# Patient Record
Sex: Male | Born: 1980 | Race: Black or African American | Hispanic: No | Marital: Single | Smoking: Never smoker
Health system: Southern US, Community
[De-identification: ages and names within clinical notes are randomized; demographics above are authoritative.]

## PROBLEM LIST (undated history)

## (undated) DIAGNOSIS — K219 Gastro-esophageal reflux disease without esophagitis: Secondary | ICD-10-CM

## (undated) DIAGNOSIS — F4310 Post-traumatic stress disorder, unspecified: Secondary | ICD-10-CM

## (undated) DIAGNOSIS — G4730 Sleep apnea, unspecified: Secondary | ICD-10-CM

## (undated) DIAGNOSIS — I10 Essential (primary) hypertension: Secondary | ICD-10-CM

## (undated) HISTORY — PX: OTHER SURGICAL HISTORY: SHX169

---

## 2004-01-24 ENCOUNTER — Emergency Department (HOSPITAL_COMMUNITY): Admission: EM | Admit: 2004-01-24 | Discharge: 2004-01-24 | Payer: Self-pay | Admitting: Emergency Medicine

## 2005-05-03 ENCOUNTER — Emergency Department (HOSPITAL_COMMUNITY): Admission: EM | Admit: 2005-05-03 | Discharge: 2005-05-03 | Payer: Self-pay | Admitting: Emergency Medicine

## 2005-05-04 ENCOUNTER — Emergency Department (HOSPITAL_COMMUNITY): Admission: EM | Admit: 2005-05-04 | Discharge: 2005-05-04 | Payer: Self-pay | Admitting: Emergency Medicine

## 2005-05-05 ENCOUNTER — Ambulatory Visit (HOSPITAL_COMMUNITY): Admission: RE | Admit: 2005-05-05 | Discharge: 2005-05-05 | Payer: Self-pay | Admitting: Emergency Medicine

## 2010-08-04 ENCOUNTER — Ambulatory Visit (HOSPITAL_BASED_OUTPATIENT_CLINIC_OR_DEPARTMENT_OTHER): Admission: RE | Admit: 2010-08-04 | Discharge: 2010-08-04 | Payer: Self-pay | Admitting: Ophthalmology

## 2011-03-16 NOTE — Consult Note (Signed)
[name], VOTH NO.:  [id number]   MEDICAL RECORD NO.:  [id number]          PATIENT TYPE:  EMS   LOCATION:  [facility]                         FACILITY:  [facility]   PHYSICIAN:  Marlan Palau, M.D.  DATE OF BIRTH:  1980/12/24   DATE OF CONSULTATION:  05/03/2005  DATE OF DISCHARGE:                                   CONSULTATION   HISTORY OF PRESENT ILLNESS:  Larry Dean is a 30 year old right handed  black gentleman born [birth year] with a history of some borderline  hypertension. The patient has not been on any medications. Does not have a  primary care physician. The patient noted a week ago that he had onset of  some right sided headache ear pain followed by weakness of the right face.  Two to three days later, the patient noted the same thing happening on the  left side with left ear pain and weakness involving the left side of the  face. The patient presents with gradually worsening facial diplegia, some  alteration in speech due to the facial diplegia, trouble keeping fluids in  the mouth, alteration in taste to some degree. The patient has noted no  numbness or weakness on the arms or legs. The patient denies double vision.  Has had some tearing of the right eye and scratchy feeling to the right eye.  The patient denies balance troubles. Again, no problems in controlling the  bowels or the bladder. The patient was seen at [facility] and was sent to the emergency room for evaluation. The patient denies  fevers or chills. Does report some sort of a bug bite within the last week  or two, prior to this evaluation. The patient denies any tic exposure.   PAST MEDICAL HISTORY:  1.  History of facial diplegia.  2.  Borderline hypertension.   MEDICATIONS:  None.   SOCIAL HISTORY:  Does not smoke or drink. The patient is single. Does work  out as a Writer. Has 1 daughter who is alive and well.   FAMILY HISTORY:  Mother is alive. Has a  history of HIV. Father has history  of alcoholism. The patient has 1 brother and 1 sister who are alive and  well.   ALLERGIES:  No known drug allergies.   REVIEW OF SYSTEMS:  No recent fevers or chills. The patient denies any  headaches other than what has been mentioned. Denies neck stiffness. Does  occasional feel as if the nose is stopped up but otherwise, no significant  problems breathing. The patient feels as if his tongue is swollen. Denies  chest pain. Denies nausea and vomiting. No abdominal pain. Denies blackout  episodes, dizziness, or seizures.   PHYSICAL EXAMINATION:  VITAL SIGNS:  Blood pressure is 129/77, heart rate  86, respiratory rate 16, temperature afebrile.  GENERAL:  The patient is a fairly well developed, black male who is alert  and cooperative at the time of examination.  HEENT:  Head atraumatic. Pupils are equal, round, and reactive to light.  Disks soft and flat bilaterally.  NECK:  Supple. NO carotid bruits noted.  RESPIRATORY:  Examination is clear.  CARDIOVASCULAR:  Examination reveals regular rate and rhythm. No obvious  murmurs or rubs noted.  EXTREMITIES:  Without significant edema.  NEUROLOGIC:  Cranial nerves as above. The patient has obvious facial  weakness bilaterally in a peripheral 7th. The patient has full extraocular  movements. Visual fields are full. The patient has good jaw strength and  good strength above the tongue with tongue in the midline. Good lateral  strength of the tongue. Good strength with head turning and shoulder shrug  bilaterally. Speech is without aphasia. Is slightly dysarthric. Motor tests  reveal 5 out of 5 strength in all 4's. Good symmetric motor tone. Sensory  testing is intact to pin prick, soft touch sensation throughout including  the face. The patient has good vibratory sensation of all 4's. The patient  has good finger-to-nose and heel-to-shin. The patient is not ambulated. Deep  tendon reflexes are suppressed  but present including ankle jerks. Toes  downgoing bilaterally. No drift is seen. Tympanic membranes appear to be  slightly erythematous bilaterally, left greater than right.   LABORATORY DATA:  Values are pending at this time.   IMPRESSION:  Facial diplegia.   The patient has examination consistent with bilateral Bell's palsy. It seems  to have begun on the right first and then spread to the left. When taken in  isolation, the history is very consistent with a simple Bell's palsy with  pain around the ear, onset of weakness in the right fact. Is isolated to the  facial nerve, some involvement of taste. No other cranial nerve  abnormalities noted. Given the bilateral onset of the problem, differential  diagnosis is a bit different. Do need to consider and rule out the  possibility of human immunodeficiency virus infection, sarcoidosis, Lyme's  disease, and even consider the possibility of a glioma of the pons, although  onset is rather rapid for this. Will proceed with further workup at this  time.   PLAN:  1.  MRI scan of the brain with and without gadolinium.  2.  Check blood work today to include sed rate, ANA, rheumatoid factor. Will      check angiotensin converting enzyme level and human immunodeficiency      virus panel.  3.  Consider lumbar puncture following MRI scan of the brain with and      without gadolinium. LP will be checked for Lyme's panel, low grade      meningitis. The patient will require outpatient followup.  4.  Again, there is a chance that this simply could be an unusual case of      bilateral Bell's palsy in isolation.       CKW/MEDQ  D:  05/03/2005  T:  05/03/2005  Job:  [id number]   cc:   [facility]  [address]   [facility]  [phone]  [place], [place] [address]

## 2012-10-26 ENCOUNTER — Emergency Department (HOSPITAL_COMMUNITY): Payer: Self-pay

## 2012-10-26 ENCOUNTER — Encounter (HOSPITAL_COMMUNITY): Payer: Self-pay | Admitting: Internal Medicine

## 2012-10-26 ENCOUNTER — Observation Stay (HOSPITAL_COMMUNITY)
Admission: EM | Admit: 2012-10-26 | Discharge: 2012-10-27 | Disposition: A | Discharge status: Home / Self Care | Payer: Self-pay | Attending: Internal Medicine | Admitting: Internal Medicine

## 2012-10-26 DIAGNOSIS — I10 Essential (primary) hypertension: Secondary | ICD-10-CM | POA: Diagnosis present

## 2012-10-26 DIAGNOSIS — R51 Headache: Secondary | ICD-10-CM | POA: Insufficient documentation

## 2012-10-26 DIAGNOSIS — G510 Bell's palsy: Principal | ICD-10-CM | POA: Diagnosis present

## 2012-10-26 DIAGNOSIS — I160 Hypertensive urgency: Secondary | ICD-10-CM | POA: Diagnosis present

## 2012-10-26 DIAGNOSIS — G43109 Migraine with aura, not intractable, without status migrainosus: Secondary | ICD-10-CM

## 2012-10-26 DIAGNOSIS — G459 Transient cerebral ischemic attack, unspecified: Secondary | ICD-10-CM | POA: Diagnosis present

## 2012-10-26 DIAGNOSIS — R519 Headache, unspecified: Secondary | ICD-10-CM | POA: Diagnosis present

## 2012-10-26 HISTORY — DX: Essential (primary) hypertension: I10

## 2012-10-26 LAB — CBC WITH DIFFERENTIAL/PLATELET
Basophils Relative: 0 % (ref 0–1)
HCT: 41.4 % (ref 39.0–52.0)
Lymphocytes Relative: 31 % (ref 12–46)
Lymphs Abs: 1.9 10*3/uL (ref 0.7–4.0)
MCH: 27.1 pg (ref 26.0–34.0)
MCV: 80.2 fL (ref 78.0–100.0)
Monocytes Absolute: 0.7 10*3/uL (ref 0.1–1.0)
Monocytes Relative: 11 % (ref 3–12)
Neutro Abs: 3.5 10*3/uL (ref 1.7–7.7)
Neutrophils Relative %: 57 % (ref 43–77)
RBC: 5.16 MIL/uL (ref 4.22–5.81)

## 2012-10-26 LAB — COMPREHENSIVE METABOLIC PANEL
Alkaline Phosphatase: 79 U/L (ref 39–117)
BUN: 9 mg/dL (ref 6–23)
CO2: 25 mEq/L (ref 19–32)
Potassium: 3.6 mEq/L (ref 3.5–5.1)
Sodium: 138 mEq/L (ref 135–145)

## 2012-10-26 MED ORDER — METOCLOPRAMIDE HCL 5 MG/ML IJ SOLN: 10.0000 mg | Freq: Once | INTRAMUSCULAR | Status: DC

## 2012-10-26 MED ORDER — DIPHENHYDRAMINE HCL 50 MG/ML IJ SOLN: 25.0000 mg | Freq: Once | INTRAMUSCULAR | Status: DC

## 2012-10-26 MED ORDER — KETOROLAC TROMETHAMINE 30 MG/ML IJ SOLN
30.0000 mg | Freq: Once | INTRAMUSCULAR | Status: AC
  Administered 2012-10-26: 30 mg via INTRAVENOUS
  Filled 2012-10-26: qty 1

## 2012-10-26 MED ORDER — DEXAMETHASONE SODIUM PHOSPHATE 10 MG/ML IJ SOLN
10.0000 mg | Freq: Once | INTRAMUSCULAR | Status: AC
  Administered 2012-10-26: 10 mg via INTRAVENOUS
  Filled 2012-10-26: qty 1

## 2012-10-26 MED ORDER — DEXAMETHASONE SODIUM PHOSPHATE 10 MG/ML IJ SOLN: 10.0000 mg | Freq: Once | INTRAMUSCULAR | Status: DC

## 2012-10-26 MED ORDER — METOCLOPRAMIDE HCL 5 MG/ML IJ SOLN
10.0000 mg | Freq: Once | INTRAMUSCULAR | Status: AC
  Administered 2012-10-26: 10 mg via INTRAVENOUS
  Filled 2012-10-26: qty 2

## 2012-10-26 MED ORDER — DIPHENHYDRAMINE HCL 50 MG/ML IJ SOLN
25.0000 mg | Freq: Once | INTRAMUSCULAR | Status: AC
  Administered 2012-10-26: 25 mg via INTRAVENOUS
  Filled 2012-10-26: qty 1

## 2012-10-26 NOTE — H&P (Signed)
PCP:  [facility]   Chief Complaint:   Right facial droop  HPI: Larry Dean is a 31 y.o. male   has a past medical history of Hypertension.   Presented with  He woke up  With right sided headache, persistent Right facial numbness, he endorses slurred speech and trouble eating because of facial numbness.  He have had a recent chest cold but no fever or chills. He has no hx of migraine headaches.  Denies any weakness in upper or lower extremities.  Patient states he has been diagnosed with HTN in the past but forgot what medicine he is supposed to be on and never filled in the prescription.  ER MD has contacted Neurology who felt he needs to be admitted for TIA work up.    Review of Systems:     Pertinent positives include: headaches,  slurred speech, tingling,  Constitutional:  No weight loss, night sweats, Fevers, chills, fatigue, weight loss  HEENT:  No Difficulty swallowing,Tooth/dental problems,Sore throat,  No sneezing, itching, ear ache, nasal congestion, post nasal drip,  Cardio-vascular:  No chest pain, Orthopnea, PND, anasarca, dizziness, palpitations.no Bilateral lower extremity swelling  GI:  No heartburn, indigestion, abdominal pain, nausea, vomiting, diarrhea, change in bowel habits, loss of appetite, melena, blood in stool, hematemesis Resp:  no shortness of breath at rest. No dyspnea on exertion, No excess mucus, no productive cough, No non-productive cough, No coughing up of blood.No change in color of mucus.No wheezing. Skin:  no rash or lesions. No jaundice GU:  no dysuria, change in color of urine, no urgency or frequency. No straining to urinate.  No flank pain.  Musculoskeletal:  No joint pain or no joint swelling. No decreased range of motion. No back pain.  Psych:  No change in mood or affect. No depression or anxiety. No memory loss.  Neuro: no localizing neurological complaints, no  no weakness, no double vision, no gait abnormality,  no , no confusion  Otherwise ROS are negative except for above, 10 systems were reviewed  Past Medical History: Past Medical History  Diagnosis Date  . Hypertension    History reviewed. No pertinent past surgical history.   Medications: Prior to Admission medications   Medication Sig Start Date End Date Taking? Authorizing Provider  acetaminophen (TYLENOL) 500 MG tablet Take 1,000 mg by mouth every 6 (six) hours as needed. pain   Yes Historical Provider, MD    Allergies:  No Known Allergies  Social History:  Ambulatory  independently   Lives at   home   reports that he has never smoked. He does not have any smokeless tobacco history on file. He reports that he drinks alcohol. He reports that he does not use illicit drugs.   Family History: family history includes Cancer in his father.    Physical Exam: Patient Vitals for the past 24 hrs:  BP Temp Temp src Pulse Resp SpO2  10/26/12 1903 143/81 mmHg - - 81  - -  10/26/12 1629 151/79 mmHg - - 74  20  98 %  10/26/12 1555 180/108 mmHg 98.2 F (36.8 C) Oral 81  15  97 %    1. General:  in No Acute distress 2. Psychological: Alert and  Oriented 3. Head/ENT:   Moist   Mucous Membranes                          Head Non traumatic, neck supple  Normal   Dentition 4. SKIN: normal Skin turgor,  Skin clean Dry and intact no rash 5. Heart: Regular rate and rhythm no Murmur, Rub or gallop, but distant 6. Lungs: Clear to auscultation bilaterally, no wheezes or crackles, but distant 7. Abdomen: Soft, non-tender, Non distended, obese 8. Lower extremities: no clubbing, cyanosis, or edema, obese 9. Neurologically: Right facial droop noted with numbness of right cheek. He is able to lift up eyebrows symetricaly and close both eyes no eye deviation noted. Patient feels that the muscles in his forehead and right eye are weaker but I am not able to perceive this.  10. MSK: Normal range of motion  body mass index  is unknown because there is no height or weight on file.   Labs on Admission:   [facility] 10/26/12 1715  NA 138  K 3.6  CL 105  CO2 25  GLUCOSE 101*  BUN 9  CREATININE 0.90  CALCIUM 8.6  MG --  PHOS --    Basename 10/26/12 1715  AST 24  ALT 43  ALKPHOS 79  BILITOT 0.4  PROT 7.2  ALBUMIN 3.6   No results found for this basename: LIPASE:2,AMYLASE:2 in the last 72 hours  Basename 10/26/12 1715  WBC 6.2  NEUTROABS 3.5  HGB 14.0  HCT 41.4  MCV 80.2  PLT 257   No results found for this basename: CKTOTAL:3,CKMB:3,CKMBINDEX:3,TROPONINI:3 in the last 72 hours No results found for this basename: TSH,T4TOTAL,FREET3,T3FREE,THYROIDAB in the last 72 hours No results found for this basename: VITAMINB12:2,FOLATE:2,FERRITIN:2,TIBC:2,IRON:2,RETICCTPCT:2 in the last 72 hours No results found for this basename: HGBA1C    CrCl is unknown because there is no height on file for the current visit. ABG No results found for this basename: phart, pco2, po2, hco3, tco2, acidbasedef, o2sat     No results found for this basename: DDIMER     Other results:  I have pearsonaly reviewed this: ECG REPORT  Rate:84  Rhythm: NSR ST&T Change: no ischemic changes   Cultures: No results found for this basename: sdes, specrequest, cult, reptstatus       Radiological Exams on Admission: Ct Head Wo Contrast  10/26/2012  *RADIOLOGY REPORT*  Clinical Data: Right facial numbness.  Headache.  CT HEAD WITHOUT CONTRAST  Technique:  Contiguous axial images were obtained from the base of the skull through the vertex without contrast.  Comparison: None.  Findings: No acute intracranial abnormality.  Specifically, no hemorrhage, hydrocephalus, mass lesion, acute infarction, or significant intracranial injury.  No acute calvarial abnormality. Visualized paranasal sinuses and mastoids clear.  Orbital soft tissues unremarkable.  IMPRESSION: Normal study   Original Report Authenticated By: Charlett Nose, M.D.      Chart has been reviewed  Assessment/Plan  31 yo with Headache, uncontrolled HTN, and right facial droop with questionable peripheral CN 7 involvement suggestive of possible Bell's palsy.   Present on Admission:  . TIA (transient ischemic attack) - vs atypical migraine vs Bell's palsy -  will admit based on TIA  protocol, await results of MRA/MRI, Carotid Doppler and Echo, obtain cardiac enzymes,  ECG,  Lipid panel, TSH. Order PT/OT evaluation. Will make sure patient is on antiplatelet agent.   Neurology is aware of the patient. PAtient is to be transferred to [facility] for further treatment and evaluation.  Marland Kitchen HTN (hypertension), malignant - tomorrow will initiate Norvasc 5 mg po qd and this can adjusted further as needed, for now labetalol PRN severe HTN. Current SBP 140's.  Marland Kitchen Headache - currently improving after  blood pressure under better control.  . Hypertensive urgency - patient presented with elevated blood pressure and neurological complaints. Currently blood pressure under better control he need to have close outpatient follow up to further adjust his medication.   Prophylaxis: SCD     CODE STATUS: FULL CODE  Other plan as per orders.  I have spent a total of 55 min on this admission  Larry Dean 10/26/2012, 10:34 PM

## 2012-10-26 NOTE — ED Provider Notes (Signed)
History     CSN: [id number]  Arrival date & time 10/26/12  1550   First MD Initiated Contact with Patient 10/26/12 1605      Chief Complaint  Patient presents with  . Headache    (Consider location/radiation/quality/duration/timing/severity/associated sxs/prior treatment) HPI Comments: Patient with history of HTN and Bells Palsy, presents with complaint of headaches with right sided headache. Associated symptoms include right eye twitching, right sided numbness, and slurred speech at 8:00 am. Patient has not had symptoms like this before. Denies fever or chills. Denies neck stiffness. Denies NVD or abdominal pain. Denies visual disturbance.   The history is provided by the patient. No language interpreter was used.    No past medical history on file.  No past surgical history on file.  No family history on file.  History  Substance Use Topics  . Smoking status: Not on file  . Smokeless tobacco: Not on file  . Alcohol Use: Not on file      Review of Systems  Constitutional: Negative for fever and chills.  HENT: Negative for neck stiffness.   Eyes: Negative for photophobia and visual disturbance.  Gastrointestinal: Negative for nausea, vomiting, abdominal pain and diarrhea.  Neurological: Positive for headaches. Negative for facial asymmetry and numbness.    Allergies  Review of patient's allergies indicates no known allergies.  Home Medications   Current Outpatient Rx  Name  Route  Sig  Dispense  Refill  . ACETAMINOPHEN 500 MG PO TABS   Oral   Take 1,000 mg by mouth every 6 (six) hours as needed. pain           BP 151/79  Pulse 74  Temp 98.2 F (36.8 C) (Oral)  Resp 20  SpO2 98%  Physical Exam  Nursing note and vitals reviewed. Constitutional: He is oriented to person, place, and time. He appears well-developed and well-nourished.  HENT:  Head: Normocephalic and atraumatic.  Mouth/Throat: Oropharynx is clear and moist.       No temporal bruits  appreciated.  Eyes: Conjunctivae normal and EOM are normal. No scleral icterus.  Neck: Normal range of motion. Neck supple.  Cardiovascular: Normal rate, regular rhythm and normal heart sounds.   Pulmonary/Chest: Effort normal and breath sounds normal.  Abdominal: Soft. Bowel sounds are normal. There is no tenderness.  Neurological: He is alert and oriented to person, place, and time. He exhibits normal muscle tone. Coordination normal.       Cranial nerves II-XII grossly intact. Impaired sensation to light touch on the right. No strength deficits. Good finger to nose. No pronator drift. Negative romberg. No facial droop.  Fasiculations noted on right cheek. Increased fasciculation while smiling.  Skin: Skin is warm and dry.    ED Course  Procedures (including critical care time)  Labs Reviewed - No data to display Ct Head Wo Contrast  10/26/2012  *RADIOLOGY REPORT*  Clinical Data: Right facial numbness.  Headache.  CT HEAD WITHOUT CONTRAST  Technique:  Contiguous axial images were obtained from the base of the skull through the vertex without contrast.  Comparison: None.  Findings: No acute intracranial abnormality.  Specifically, no hemorrhage, hydrocephalus, mass lesion, acute infarction, or significant intracranial injury.  No acute calvarial abnormality. Visualized paranasal sinuses and mastoids clear.  Orbital soft tissues unremarkable.  IMPRESSION: Normal study   Original Report Authenticated By: Charlett Nose, M.D.        MDM  Patient presented with presentation consistent with complicated migraine vs Majorie Santee. Impaired sensation and  right sided fasciculation on exam. CT head negative. Patient visit shared with Rhunette Croft who believes that Neurology should be consulted for admission and MRI in the morning. Patient care signed out to [facility]. Disposition pending.     Pixie Casino, PA-C 10/26/12 2015

## 2012-10-26 NOTE — ED Notes (Signed)
Pt states he woke up this morning with R side facial numbness and headache to R temple area. Pt also states his R eye was twitching. Pt states his speech was slurred when he woke up. Pt arrives with clear speech. Pt states he still has a headache to the R side of his head. States numbness to R side of face has resolved. Pt report recent illness. States he had a had a head cold this past week. Pt a/o x 4. Grips equal. No pronator drift. No facial droop.

## 2012-10-27 DIAGNOSIS — G510 Bell's palsy: Principal | ICD-10-CM | POA: Diagnosis present

## 2012-10-27 DIAGNOSIS — R6889 Other general symptoms and signs: Secondary | ICD-10-CM

## 2012-10-27 DIAGNOSIS — I10 Essential (primary) hypertension: Secondary | ICD-10-CM

## 2012-10-27 DIAGNOSIS — R0989 Other specified symptoms and signs involving the circulatory and respiratory systems: Secondary | ICD-10-CM

## 2012-10-27 LAB — LIPID PANEL
Cholesterol: 177 mg/dL (ref 0–200)
LDL Cholesterol: 111 mg/dL — ABNORMAL HIGH (ref 0–99)
LDL Cholesterol: 103 mg/dL — ABNORMAL HIGH (ref 0–99)
Total CHOL/HDL Ratio: 2.8 RATIO
Triglycerides: 48 mg/dL (ref <150)
VLDL: 9 mg/dL (ref 0–40)
VLDL: 10 mg/dL (ref 0–40)

## 2012-10-27 LAB — HEMOGLOBIN A1C: Mean Plasma Glucose: 123 mg/dL — ABNORMAL HIGH (ref <117)

## 2012-10-27 MED ORDER — METOCLOPRAMIDE HCL 5 MG/ML IJ SOLN
10.0000 mg | Freq: Once | INTRAMUSCULAR | Status: AC
  Administered 2012-10-27: 10 mg via INTRAVENOUS
  Filled 2012-10-27: qty 2

## 2012-10-27 MED ORDER — INFLUENZA VIRUS VACC SPLIT PF IM SUSP: 0.5000 mL | INTRAMUSCULAR | Status: DC

## 2012-10-27 MED ORDER — ASPIRIN 325 MG PO TABS
325.0000 mg | ORAL_TABLET | Freq: Every day | ORAL | Status: DC
  Administered 2012-10-27: 325 mg via ORAL
  Filled 2012-10-27: qty 1

## 2012-10-27 MED ORDER — TEARS RENEWED OP SOLN: 1.0000 [drp] | Freq: Four times a day (QID) | OPHTHALMIC | Status: DC | PRN

## 2012-10-27 MED ORDER — SODIUM CHLORIDE 0.9 % IV SOLN
INTRAVENOUS | Status: AC
  Administered 2012-10-27: 02:00:00 via INTRAVENOUS

## 2012-10-27 MED ORDER — ASPIRIN 325 MG PO TABS: 325.0000 mg | ORAL_TABLET | Freq: Every day | ORAL | Status: DC

## 2012-10-27 MED ORDER — LABETALOL HCL 5 MG/ML IV SOLN: 10.0000 mg | INTRAVENOUS | Status: DC | PRN

## 2012-10-27 MED ORDER — AMLODIPINE BESYLATE 10 MG PO TABS: 10.0000 mg | ORAL_TABLET | Freq: Every day | ORAL | Status: DC

## 2012-10-27 MED ORDER — INFLUENZA VIRUS VACC SPLIT PF IM SUSP
0.5000 mL | INTRAMUSCULAR | Status: DC
  Filled 2012-10-27: qty 0.5

## 2012-10-27 MED ORDER — PREDNISONE 10 MG PO TABS: 10.0000 mg | ORAL_TABLET | Freq: Every day | ORAL | Status: DC

## 2012-10-27 MED ORDER — KETOROLAC TROMETHAMINE 30 MG/ML IJ SOLN
30.0000 mg | Freq: Once | INTRAMUSCULAR | Status: AC
  Administered 2012-10-27: 30 mg via INTRAVENOUS
  Filled 2012-10-27: qty 1

## 2012-10-27 MED ORDER — ACETAMINOPHEN 325 MG PO TABS
650.0000 mg | ORAL_TABLET | ORAL | Status: DC | PRN
  Administered 2012-10-27: 650 mg via ORAL
  Filled 2012-10-27: qty 2

## 2012-10-27 MED ORDER — AMLODIPINE BESYLATE 5 MG PO TABS
5.0000 mg | ORAL_TABLET | Freq: Every day | ORAL | Status: DC
  Administered 2012-10-27: 5 mg via ORAL
  Filled 2012-10-27: qty 1

## 2012-10-27 NOTE — Progress Notes (Signed)
[facility] progress note. Chief complaint. Transfer note. History of present illness. This 31 year old male presented to [facility] emergency room with right facial droop. He has a past medical history of hypertension and he states he has previously had Bell's palsy. Neurology was also contacted and felt that the patient to require transfer to [facility] for TIA workup. Patient has now arrived and seeing the patient to ensure he remains medically stable and that his orders transferred appropriately. He states that the numbness to the right face and tongue are unchanged. Vital signs. Temperature 98.4, pulse 85, respiration 18, blood pressure 159/91. O2 sats 98%. General appearance. This is an obese young male who is alert, cooperative, in no distress. Cardiac. Rate and rhythm regular. No jugular venous distention or significant edema. Lungs. Breath sounds clear and equal. Abdomen. Soft with positive bowel sounds. No pain. Neurologic. Cranial nerves 2-12 grossly intact. I note no facial drooping at present. I find no unilateral or focal defects. Impression/plan. Problem #1. TIA versus atypical migraine versus Bell's palsy. Patient has been transferred to [facility] for TIA protocol which will include MRI/MRA, carotid Doppler, echocardiogram, etc. Patient will be seen by neurology in consult. Problem #2 hypertensive urgency. Her blood pressure is now more stable and labetalol when necessary in place as needed. Patient appears medically stable post transfer. All orders appear to transferred appropriately.

## 2012-10-27 NOTE — Progress Notes (Signed)
  Echocardiogram 2D Echocardiogram has been performed.  Larry Dean 10/27/2012, 10:18 AM

## 2012-10-27 NOTE — Progress Notes (Addendum)
NEURO HOSPITALIST PROGRESS NOTE   SUBJECTIVE:                                                                                                                         Patients facial weakness and sensation have improved. He states he only feels slightly weaker on the right side and that he has no significant numbness on right face.  He can feel his right aspect of his face but it "just feels funny".  OBJECTIVE:                                                                                                                           Vital signs in last 24 hours: Temp:  [97.6 F (36.4 C)-98.4 F (36.9 C)] 97.6 F (36.4 C) (12/30 0553) Pulse Rate:  [74-86] 81  (12/30 0553) Resp:  [15-20] 20  (12/30 0553) BP: (143-180)/(77-108) 150/77 mmHg (12/30 0553) SpO2:  [97 %-100 %] 100 % (12/30 0553) Weight:  [169.5 kg (373 lb 10.9 oz)] 169.5 kg (373 lb 10.9 oz) (12/30 0135)  Intake/Output from previous day: 12/29 0701 - 12/30 0700 In: 240 [P.O.:240] Out: -  Intake/Output this shift:   Nutritional status: Cardiac  Past Medical History  Diagnosis Date  . Hypertension     Neurologic Exam:  Mental Status: Alert, oriented, thought content appropriate.  Speech fluent without evidence of aphasia.  Able to follow 3 step commands without difficulty. Cranial Nerves: II: Visual fields grossly normal, pupils equal, round, reactive to light and accommodation III,IV, VI: ptosis not present, extra-ocular motions intact bilaterally V,VII: smile symmetric, at rest still shows a right decrease NL fold. facial light touch sensation decreased on right face more notably over right corner of his mouth. Movement of frontalis equal bilaterally, decrease ability to fully burry right eye lashes.  VIII: hearing normal bilaterally IX,X: gag reflex present XI: bilateral shoulder shrug XII: midline tongue extension Motor: Right : Upper extremity   5/5    Left:     Upper  extremity   5/5  Lower extremity   5/5     Lower extremity   5/5 Tone and bulk:normal tone throughout; no atrophy noted Sensory: Pinprick and light touch intact throughout, bilaterally Deep Tendon Reflexes:  1+ and symmetric throughout Plantars: Right: downgoing   Left: downgoing Cerebellar: normal finger-to-nose,  normal heel-to-shin test CV: pulses palpable throughout     Lab Results: No results found for this basename: cbc, bmp, coags, chol, tri, ldl, hga1c   Lipid Panel No results found for this basename: CHOL,TRIG,HDL,CHOLHDL,VLDL,LDLCALC in the last 72 hours  Studies/Results: Ct Head Wo Contrast  10/26/2012  *RADIOLOGY REPORT*  Clinical Data: Right facial numbness.  Headache.  CT HEAD WITHOUT CONTRAST  Technique:  Contiguous axial images were obtained from the base of the skull through the vertex without contrast.  Comparison: None.  Findings: No acute intracranial abnormality.  Specifically, no hemorrhage, hydrocephalus, mass lesion, acute infarction, or significant intracranial injury.  No acute calvarial abnormality. Visualized paranasal sinuses and mastoids clear.  Orbital soft tissues unremarkable.  IMPRESSION: Normal study   Original Report Authenticated By: Charlett Nose, M.D.     MEDICATIONS                                                                                                                        Scheduled:   . amLODipine  5 mg Oral Daily  . aspirin  325 mg Oral Daily    ASSESSMENT/PLAN:                                                                                                               Patient Active Hospital Problem List: Bell's Palsy(10/26/2012)   Assessment: Patients symptoms of decreased sensation have improved but not fully resolved. HE continues to show right obicularis oculi  Weakness and decreased taste on right tongue.    Plan:  1) MRI pending 2) Trial of prednisone 60 mg per day for one week then taper by 10 mg per day.  3)  Artificial tears and Lacri-Lube this patient develops inability to close his right eye.    Felicie Morn PA-C [facility] [phone]  10/27/2012, 8:21 AM   I have reviewed this note.   Ritta Slot, MD [facility] [phone]  If 7pm- 7am, please page neurology on call at [phone].

## 2012-10-27 NOTE — Progress Notes (Signed)
OT/PT DISCHARGE  Order received, chart reviewed, in to seen pt earlier but he was coming out of the bathroom by himself headed towards the door of his room to get into a W/C to go down for a test. Pt able to manage the bathroom door without issues and no balance issues noted when he was walking from bathroom to the door of his room, nor turning around to sit in W/C. In to speak to pt about OT/PT services and pt reports no issues with BADLs or mobility, only issue is still numbness in face--but he reports it feels better today than yesterday (that his speech is better and not quite as numb feeling on his face). No OT/PT needs noted, will not eval. Acute OT/PT to sign off.

## 2012-10-27 NOTE — Consult Note (Signed)
NEURO HOSPITALIST CONSULT NOTE    Reason for Consult: Right facial and tongue numbness.  HPI:                                                                                                                                          Larry Dean is an 31 y.o. male history of hypertension and history of Bell's palsy 5 years ago presenting with right facial numbness as well as numbness of right side of his tongue. Symptoms were present when he woke up this morning. He had similar right facial and tongue numbness 5 years ago at the onset of Bell's palsy. He indicates that he subsequently developed left Bell's palsy as well. There was complete resolution of facial weakness on both sides as well as return of normal taste sensation. He has lost taste on the right side of his tongue with his current symptoms. He has not noticed any weakness of the right side of his face. He's had pain on the right side of his head. CT scan of his head showed no acute intracranial abnormality. He's had no focal weakness nor numbness involving extremities. Speech has not changed.  Past Medical History  Diagnosis Date  . Hypertension    Bell's palsy 5 years ago, right and left.  History reviewed. No pertinent past surgical history.  Family History  Problem Relation Age of Onset  . Cancer Father     Social History:  reports that he has never smoked. He does not have any smokeless tobacco history on file. He reports that he drinks alcohol. He reports that he does not use illicit drugs.  No Known Allergies  MEDICATIONS:                                                                                                                     Prior to Admission:  Prescriptions prior to admission  Medication Sig Dispense Refill  . acetaminophen (TYLENOL) 500 MG tablet Take 1,000 mg by mouth every 6 (six) hours as needed. pain          Blood pressure 159/91, pulse 85, temperature 98.4 F (36.9 C),  temperature source Oral, resp. rate 18, height 5\' 10"  (1.778 m), weight 169.5 kg (373 lb 10.9 oz), SpO2 98.00%.  Neurologic Examination:                                                                                                      Mental Status: Alert, oriented, thought content appropriate.  Speech fluent without evidence of aphasia. Able to follow commands without difficulty. Cranial Nerves: II-Visual fields were normal. III/IV/VI-Pupils were equal and reacted. Extraocular movements were full and conjugate.    V/VII-reduced sensation to tactile stimulation over the entire right face, compared to left; mild weakness of left frontalis, orbicularis oculi and orbicularis oris muscles; no weakness of facial muscles on the left side. VIII-normal. X-normal speech and symmetrical palatal movement. XII-midline tongue extension Motor: 5/5 bilaterally with normal tone and bulk Sensory: Normal over right and left extremities. Deep Tendon Reflexes: 1+ and symmetric. Plantars: Flexor bilaterally Cerebellar: Normal finger-to-nose testing.  No results found for this basename: cbc, bmp, coags, chol, tri, ldl, hga1c    Results for orders placed during the hospital encounter of 10/26/12 (from the past 48 hour(s))  CBC WITH DIFFERENTIAL     Status: Normal   Collection Time   10/26/12  5:15 PM      Component Value Range Comment   WBC 6.2  4.0 - 10.5 K/uL    RBC 5.16  4.22 - 5.81 MIL/uL    Hemoglobin 14.0  13.0 - 17.0 g/dL    HCT 14.7  82.9 - 56.2 %    MCV 80.2  78.0 - 100.0 fL    MCH 27.1  26.0 - 34.0 pg    MCHC 33.8  30.0 - 36.0 g/dL    RDW 13.0  86.5 - 78.4 %    Platelets 257  150 - 400 K/uL    Neutrophils Relative 57  43 - 77 %    Neutro Abs 3.5  1.7 - 7.7 K/uL    Lymphocytes Relative 31  12 - 46 %    Lymphs Abs 1.9  0.7 - 4.0 K/uL    Monocytes Relative 11  3 - 12 %    Monocytes Absolute 0.7  0.1 - 1.0 K/uL    Eosinophils Relative 1  0 - 5 %    Eosinophils Absolute 0.1  0.0 - 0.7  K/uL    Basophils Relative 0  0 - 1 %    Basophils Absolute 0.0  0.0 - 0.1 K/uL   COMPREHENSIVE METABOLIC PANEL     Status: Abnormal   Collection Time   10/26/12  5:15 PM      Component Value Range Comment   Sodium 138  135 - 145 mEq/L    Potassium 3.6  3.5 - 5.1 mEq/L    Chloride 105  96 - 112 mEq/L    CO2 25  19 - 32 mEq/L    Glucose, Bld 101 (*) 70 - 99 mg/dL    BUN 9  6 - 23 mg/dL    Creatinine, Ser 6.96  0.50 - 1.35 mg/dL    Calcium 8.6  8.4 - 29.5 mg/dL    Total Protein 7.2  6.0 - 8.3 g/dL  Albumin 3.6  3.5 - 5.2 g/dL    AST 24  0 - 37 U/L    ALT 43  0 - 53 U/L    Alkaline Phosphatase 79  39 - 117 U/L    Total Bilirubin 0.4  0.3 - 1.2 mg/dL    GFR calc non Af Amer >90  >90 mL/min    GFR calc Af Amer >90  >90 mL/min     Ct Head Wo Contrast  10/26/2012  *RADIOLOGY REPORT*  Clinical Data: Right facial numbness.  Headache.  CT HEAD WITHOUT CONTRAST  Technique:  Contiguous axial images were obtained from the base of the skull through the vertex without contrast.  Comparison: None.  Findings: No acute intracranial abnormality.  Specifically, no hemorrhage, hydrocephalus, mass lesion, acute infarction, or significant intracranial injury.  No acute calvarial abnormality. Visualized paranasal sinuses and mastoids clear.  Orbital soft tissues unremarkable.  IMPRESSION: Normal study   Original Report Authenticated By: Charlett Nose, M.D.      Assessment/Plan: Acute right facial weakness as well as mild right upper and lower facial numbness, most likely manifestations of recurrent Bell's palsy, including abnormal sensation and loss of taste involving the right side of his tongue. No clinical signs of stroke nor TIA.  Recommendations: 1. Agree with obtaining MRI of the brain is planned, without contrast. 2. No further TIA or stroke workup if MRI is unremarkable. 3. Trial of prednisone 60 mg per day for one week then taper by 10 mg per day. 4. Artificial tears and Lacri-Lube this  patient develops inability to close his right eye.  Venetia Maxon M.D. [facility] [phone]  10/27/2012, 1:38 AM

## 2012-10-27 NOTE — Progress Notes (Signed)
VASCULAR LAB PRELIMINARY  PRELIMINARY  PRELIMINARY  PRELIMINARY  Carotid duplex completed.    Preliminary report: No significant ICA stenosis or plaque noted bilaterally.  Vertebral artery flow antegrade.  Jonty Morrical, RVT 10/27/2012, 10:46 AM

## 2012-10-27 NOTE — ED Provider Notes (Signed)
Medical screening examination/treatment/procedure(s) were performed by non-physician practitioner and as supervising physician I was immediately available for consultation/collaboration.  Daxx Tiggs, MD 10/27/12 0011 

## 2012-10-27 NOTE — Discharge Summary (Signed)
Physician Discharge Summary  Larry Dean MRN: [id number] DOB/AGE: 1981-08-31 31 y.o.  PCP: No primary provider on file.   Admit date: 10/26/2012 Discharge date: 10/27/2012  Discharge Diagnoses:     TIA (transient ischemic attack)  HTN (hypertension), malignant  Headache  Hypertensive urgency  Bell's palsy     Medication List     As of 10/27/2012  8:25 AM    TAKE these medications         acetaminophen 500 MG tablet   Commonly known as: TYLENOL   Take 1,000 mg by mouth every 6 (six) hours as needed. pain      amLODipine 10 MG tablet   Commonly known as: NORVASC   Take 1 tablet (10 mg total) by mouth daily.      aspirin 325 MG tablet   Take 1 tablet (325 mg total) by mouth daily.      dextran 70-hypromellose ophthalmic solution   Place 1 drop into both eyes 4 (four) times daily as needed.      predniSONE 10 MG tablet   Commonly known as: DELTASONE   Take 1 tablet (10 mg total) by mouth daily.        Discharge Condition: Stable  Disposition: Final discharge disposition not confirmed   Consults: Neurology   Significant Diagnostic Studies: Ct Head Wo Contrast  10/26/2012  *RADIOLOGY REPORT*  Clinical Data: Right facial numbness.  Headache.  CT HEAD WITHOUT CONTRAST  Technique:  Contiguous axial images were obtained from the base of the skull through the vertex without contrast.  Comparison: None.  Findings: No acute intracranial abnormality.  Specifically, no hemorrhage, hydrocephalus, mass lesion, acute infarction, or significant intracranial injury.  No acute calvarial abnormality. Visualized paranasal sinuses and mastoids clear.  Orbital soft tissues unremarkable.  IMPRESSION: Normal study   Original Report Authenticated By: Charlett Nose, M.D.        Microbiology: No results found for this or any previous visit (from the past 240 hour(s)).   Labs: Results for orders placed during the hospital encounter of 10/26/12 (from the past 48 hour(s))    CBC WITH DIFFERENTIAL     Status: Normal   Collection Time   10/26/12  5:15 PM      Component Value Range Comment   WBC 6.2  4.0 - 10.5 K/uL    RBC 5.16  4.22 - 5.81 MIL/uL    Hemoglobin 14.0  13.0 - 17.0 g/dL    HCT 11.9  14.7 - 82.9 %    MCV 80.2  78.0 - 100.0 fL    MCH 27.1  26.0 - 34.0 pg    MCHC 33.8  30.0 - 36.0 g/dL    RDW 56.2  13.0 - 86.5 %    Platelets 257  150 - 400 K/uL    Neutrophils Relative 57  43 - 77 %    Neutro Abs 3.5  1.7 - 7.7 K/uL    Lymphocytes Relative 31  12 - 46 %    Lymphs Abs 1.9  0.7 - 4.0 K/uL    Monocytes Relative 11  3 - 12 %    Monocytes Absolute 0.7  0.1 - 1.0 K/uL    Eosinophils Relative 1  0 - 5 %    Eosinophils Absolute 0.1  0.0 - 0.7 K/uL    Basophils Relative 0  0 - 1 %    Basophils Absolute 0.0  0.0 - 0.1 K/uL   COMPREHENSIVE METABOLIC PANEL     Status:  Abnormal   Collection Time   10/26/12  5:15 PM      Component Value Range Comment   Sodium 138  135 - 145 mEq/L    Potassium 3.6  3.5 - 5.1 mEq/L    Chloride 105  96 - 112 mEq/L    CO2 25  19 - 32 mEq/L    Glucose, Bld 101 (*) 70 - 99 mg/dL    BUN 9  6 - 23 mg/dL    Creatinine, Ser 5.78  0.50 - 1.35 mg/dL    Calcium 8.6  8.4 - 46.9 mg/dL    Total Protein 7.2  6.0 - 8.3 g/dL    Albumin 3.6  3.5 - 5.2 g/dL    AST 24  0 - 37 U/L    ALT 43  0 - 53 U/L    Alkaline Phosphatase 79  39 - 117 U/L    Total Bilirubin 0.4  0.3 - 1.2 mg/dL    GFR calc non Af Amer >90  >90 mL/min    GFR calc Af Amer >90  >90 mL/min      HPI : Larry Dean is an 31 y.o. male history of hypertension and history of Bell's palsy 5 years ago presenting with right facial numbness as well as numbness of right side of his tongue. Symptoms were present when he woke up this morning. He had similar right facial and tongue numbness 5 years ago at the onset of Bell's palsy. He indicates that he subsequently developed left Bell's palsy as well. There was complete resolution of facial weakness on both sides as well as  return of normal taste sensation. He has lost taste on the right side of his tongue with his current symptoms. He has not noticed any weakness of the right side of his face. He's had pain on the right side of his head. CT scan of his head showed no acute intracranial abnormality. He's had no focal weakness nor numbness involving extremities. Speech has not changed.  HOSPITAL COURSE: Acute right facial weakness as well as mild right upper and lower facial numbness, most likely manifestations of recurrent Bell's palsy, including abnormal sensation and loss of taste involving the right side of his tongue. No clinical signs of stroke nor TIA.  Hospital course 1. MRI could not be obtained because of being overweight.  2. No further TIA or stroke workup 3. Trial of prednisone 60 mg per day for one week then taper by 10 mg per day.  4. Artificial tears and Lacri-Lube this patient develops inability to close his right eye. Follow up with neurology in 2 weeks   Discharge Exam:   Blood pressure 150/77, pulse 81, temperature 97.6 F (36.4 C), temperature source Oral, resp. rate 20, height 5\' 10"  (1.778 m), weight 169.5 kg (373 lb 10.9 oz), SpO2 100.00%.           Follow-up Information    Follow up with Primary care provider. Schedule an appointment as soon as possible for a visit in 1 week.      Follow up with Lesly Dukes, MD. Schedule an appointment as soon as possible for a visit in 2 weeks. (Followup of Bell's palsy)    Contact information:   [address], [address] [address] [facility] [place] [address] [phone]          Signed: Richarda Overlie 10/27/2012, 8:25 AM

## 2012-10-27 NOTE — Progress Notes (Signed)
OT/PT DISCHARGE  Order received, chart reviewed, in to seen pt earlier but he was coming out of the bathroom by himself headed towards the door of his room to get into a W/C to go down for a test. Pt able to manage the bathroom door without issues and no balance issues noted when he was walking from bathroom to the door of his room, nor turning around to sit in W/C. In to speak to pt about OT/PT services and pt reports no issues with BADLs or mobility, only issue is still numbness in face--but he reports it feels better today than yesterday (that his speech is better and not quite as numb feeling on his face). No OT/PT needs noted, will not eval. Acute OT/PT to sign off.  Lewis Shock, PT, DPT Pager #: [phone] Office #: [phone]

## 2012-10-27 NOTE — Progress Notes (Signed)
OT Cancellation Note  Patient Details Name: Larry Dean MRN: [id number] DOB: 03-29-1981   Cancelled Treatment:     Pt out of room for a test. Will try to check on him today since I see that he may go home today.  Evette Georges [phone] 10/27/2012, 10:07 AM

## 2012-10-28 NOTE — Care Management Note (Signed)
    Page 1 of 1   10/28/2012     8:17:05 AM   CARE MANAGEMENT NOTE 10/28/2012  Patient:  Larry Dean, Larry Dean   Account Number:  [id number]  Date Initiated:  10/27/2012  Documentation initiated by:  [facility]  Subjective/Objective Assessment:   Admitted for TIA workup     Action/Plan:   PT/OT evals-no follow up needed   Anticipated DC Date:  10/28/2012   Anticipated DC Plan:  HOME/SELF CARE      DC Planning Services  CM consult      Choice offered to / List presented to:             Status of service:  Completed, signed off Medicare Important Message given?   (If response is "NO", the following Medicare IM given date fields will be blank) Date Medicare IM given:   Date Additional Medicare IM given:    Discharge Disposition:  HOME/SELF CARE  Per UR Regulation:  Reviewed for med. necessity/level of care/duration of stay  If discussed at Long Length of Stay Meetings, dates discussed:    Comments:

## 2016-01-25 ENCOUNTER — Emergency Department (HOSPITAL_COMMUNITY): Payer: Managed Care, Other (non HMO)

## 2016-01-25 ENCOUNTER — Emergency Department (HOSPITAL_COMMUNITY)
Admission: EM | Admit: 2016-01-25 | Discharge: 2016-01-25 | Disposition: A | Discharge status: Home / Self Care | Payer: Managed Care, Other (non HMO) | Attending: Emergency Medicine | Admitting: Emergency Medicine

## 2016-01-25 ENCOUNTER — Encounter (HOSPITAL_COMMUNITY): Payer: Self-pay | Admitting: Emergency Medicine

## 2016-01-25 DIAGNOSIS — Z79899 Other long term (current) drug therapy: Secondary | ICD-10-CM | POA: Insufficient documentation

## 2016-01-25 DIAGNOSIS — R6889 Other general symptoms and signs: Secondary | ICD-10-CM

## 2016-01-25 DIAGNOSIS — I10 Essential (primary) hypertension: Secondary | ICD-10-CM | POA: Diagnosis not present

## 2016-01-25 DIAGNOSIS — R000 Tachycardia, unspecified: Secondary | ICD-10-CM | POA: Insufficient documentation

## 2016-01-25 DIAGNOSIS — M791 Myalgia: Secondary | ICD-10-CM | POA: Diagnosis not present

## 2016-01-25 DIAGNOSIS — J029 Acute pharyngitis, unspecified: Secondary | ICD-10-CM | POA: Insufficient documentation

## 2016-01-25 DIAGNOSIS — K5900 Constipation, unspecified: Secondary | ICD-10-CM | POA: Diagnosis not present

## 2016-01-25 DIAGNOSIS — R51 Headache: Secondary | ICD-10-CM | POA: Diagnosis not present

## 2016-01-25 DIAGNOSIS — R112 Nausea with vomiting, unspecified: Secondary | ICD-10-CM | POA: Insufficient documentation

## 2016-01-25 DIAGNOSIS — R5383 Other fatigue: Secondary | ICD-10-CM | POA: Diagnosis not present

## 2016-01-25 DIAGNOSIS — K429 Umbilical hernia without obstruction or gangrene: Secondary | ICD-10-CM | POA: Diagnosis not present

## 2016-01-25 DIAGNOSIS — R509 Fever, unspecified: Secondary | ICD-10-CM | POA: Insufficient documentation

## 2016-01-25 DIAGNOSIS — R1033 Periumbilical pain: Secondary | ICD-10-CM | POA: Insufficient documentation

## 2016-01-25 LAB — COMPREHENSIVE METABOLIC PANEL
ALT: 32 U/L (ref 17–63)
ANION GAP: 12 (ref 5–15)
AST: 27 U/L (ref 15–41)
Albumin: 4.1 g/dL (ref 3.5–5.0)
Alkaline Phosphatase: 71 U/L (ref 38–126)
BILIRUBIN TOTAL: 0.8 mg/dL (ref 0.3–1.2)
BUN: 8 mg/dL (ref 6–20)
CALCIUM: 9.3 mg/dL (ref 8.9–10.3)
CO2: 22 mmol/L (ref 22–32)
Chloride: 102 mmol/L (ref 101–111)
Creatinine, Ser: 1.11 mg/dL (ref 0.61–1.24)
GFR calc Af Amer: >60 mL/min (ref >60)
Glucose, Bld: 96 mg/dL (ref 65–99)
POTASSIUM: 3.9 mmol/L (ref 3.5–5.1)
SODIUM: 136 mmol/L (ref 135–145)
TOTAL PROTEIN: 7.3 g/dL (ref 6.5–8.1)

## 2016-01-25 LAB — URINALYSIS, ROUTINE W REFLEX MICROSCOPIC
BILIRUBIN URINE: NEGATIVE
Glucose, UA: NEGATIVE mg/dL
Hgb urine dipstick: NEGATIVE
KETONES UR: NEGATIVE mg/dL
NITRITE: NEGATIVE
PH: 6 (ref 5.0–8.0)
PROTEIN: NEGATIVE mg/dL
Specific Gravity, Urine: 1.029 (ref 1.005–1.030)

## 2016-01-25 LAB — I-STAT CG4 LACTIC ACID, ED
LACTIC ACID, VENOUS: 2.01 mmol/L — AB (ref 0.5–2.0)
LACTIC ACID, VENOUS: 1.78 mmol/L (ref 0.5–2.0)

## 2016-01-25 LAB — URINE MICROSCOPIC-ADD ON

## 2016-01-25 LAB — CBC
HEMATOCRIT: 43.2 % (ref 39.0–52.0)
Hemoglobin: 14.2 g/dL (ref 13.0–17.0)
MCH: 26.8 pg (ref 26.0–34.0)
MCHC: 32.9 g/dL (ref 30.0–36.0)
MCV: 81.7 fL (ref 78.0–100.0)
Platelets: 254 10*3/uL (ref 150–400)
RBC: 5.29 MIL/uL (ref 4.22–5.81)
RDW: 13.6 % (ref 11.5–15.5)
WBC: 13.9 10*3/uL — AB (ref 4.0–10.5)

## 2016-01-25 LAB — LIPASE, BLOOD: LIPASE: 18 U/L (ref 11–51)

## 2016-01-25 MED ORDER — HYDROMORPHONE HCL 1 MG/ML IJ SOLN
1.0000 mg | Freq: Once | INTRAMUSCULAR | Status: AC
  Administered 2016-01-25: 1 mg via INTRAVENOUS
  Filled 2016-01-25: qty 1

## 2016-01-25 MED ORDER — SODIUM CHLORIDE 0.9 % IV BOLUS (SEPSIS)
1000.0000 mL | Freq: Once | INTRAVENOUS | Status: AC
  Administered 2016-01-25: 1000 mL via INTRAVENOUS

## 2016-01-25 MED ORDER — ACETAMINOPHEN 325 MG PO TABS
650.0000 mg | ORAL_TABLET | Freq: Once | ORAL | Status: AC | PRN
  Administered 2016-01-25: 650 mg via ORAL

## 2016-01-25 MED ORDER — ONDANSETRON 4 MG PO TBDP: 4.0000 mg | ORAL_TABLET | Freq: Three times a day (TID) | ORAL | Status: DC | PRN

## 2016-01-25 MED ORDER — IOHEXOL 300 MG/ML  SOLN
50.0000 mL | Freq: Once | INTRAMUSCULAR | Status: AC | PRN
  Administered 2016-01-25: 50 mL via INTRAVENOUS

## 2016-01-25 MED ORDER — ACETAMINOPHEN 325 MG PO TABS
ORAL_TABLET | ORAL | Status: AC
  Filled 2016-01-25: qty 2

## 2016-01-25 NOTE — ED Notes (Signed)
Pt back from CT

## 2016-01-25 NOTE — ED Provider Notes (Signed)
Most other procedures CSN: [id number]     Arrival date & time 01/25/16  1342 History   First MD Initiated Contact with Patient 01/25/16 1740     Chief Complaint  Patient presents with  . Abdominal Pain  . Chills     HPI  35 y.o. male with history of hypertension presenting with 2 days of abdominal pain, fever, generalized myalgias, chills, vomiting, constipation. Patient has a ventral hernia just right of his umbilicus that present for about a year. He saw his primary care doctor regarding the hernia but was told that he needed to lose weight prior to surgery. Reports generalized abdominal pain worse around the location of the hernia over the last 2 days. He is able to fully reduce it when it pops out. The fever started last night, Tmax of 102 degrees. He had one episode of emesis. He has not had a bowel movement in 2 days which is unusual for him, and has been unable to pass flatus though he feels like he needs to. Also endorses mild sore throat, headaches when his fever spikes, and generalized body aches. He did not receive a flu shot this year. No history of prior abdominal surgeries.   Past Medical History  Diagnosis Date  . Hypertension    History reviewed. No pertinent past surgical history. Family History  Problem Relation Age of Onset  . Cancer Father    Social History  Substance Use Topics  . Smoking status: Never Smoker   . Smokeless tobacco: None  . Alcohol Use: Yes     Comment: once a week    Review of Systems  Constitutional: Positive for fever, chills and fatigue. Negative for activity change and appetite change.  HENT: Positive for sore throat. Negative for congestion, facial swelling and rhinorrhea.   Eyes: Negative for visual disturbance.  Respiratory: Negative for cough, shortness of breath and wheezing.   Cardiovascular: Negative for chest pain, palpitations and leg swelling.  Gastrointestinal: Positive for nausea, vomiting, abdominal pain and constipation.  Negative for diarrhea, blood in stool and abdominal distention.  Genitourinary: Negative for dysuria, frequency, hematuria, flank pain and difficulty urinating.  Musculoskeletal: Positive for myalgias (diffuse). Negative for back pain, joint swelling, arthralgias, neck pain and neck stiffness.  Skin: Negative for rash.  Neurological: Negative for dizziness, weakness, light-headedness and headaches.  Psychiatric/Behavioral: Negative for behavioral problems, confusion and agitation.      Allergies  Review of patient's allergies indicates no known allergies.  Home Medications   Prior to Admission medications   Medication Sig Start Date End Date Taking? Authorizing Provider  acetaminophen (TYLENOL) 500 MG tablet Take 1,000 mg by mouth every 6 (six) hours as needed. pain   Yes Historical Provider, MD  amLODipine (NORVASC) 10 MG tablet Take 1 tablet (10 mg total) by mouth daily. 10/27/12  Yes Richarda Overlie, MD  ondansetron (ZOFRAN ODT) 4 MG disintegrating tablet Take 1 tablet (4 mg total) by mouth every 8 (eight) hours as needed for nausea or vomiting. 01/25/16   Ewald Beg Ernestina Penna, MD   BP 164/99 mmHg  Pulse 89  Temp(Src) 100.6 F (38.1 C) (Oral)  Resp 30  SpO2 96% Physical Exam  Constitutional: He is oriented to person, place, and time. He appears well-developed and well-nourished. No distress.  HENT:  Head: Normocephalic and atraumatic.  Right Ear: External ear normal.  Left Ear: External ear normal.  Nose: Nose normal.  Mouth/Throat: Oropharynx is clear and moist. No oropharyngeal exudate.  Eyes: Conjunctivae are normal.  Pupils are equal, round, and reactive to light. Right eye exhibits no discharge. Left eye exhibits no discharge. No scleral icterus.  Neck: Normal range of motion. Neck supple. No tracheal deviation present.  Cardiovascular: Regular rhythm, normal heart sounds and intact distal pulses.  Exam reveals no gallop and no friction rub.   No murmur heard. Tachycardic to  110 bpm  Pulmonary/Chest: Effort normal and breath sounds normal. No respiratory distress. He has no wheezes. He has no rales.  Abdominal: Soft. Bowel sounds are normal. He exhibits no distension and no mass. There is tenderness (periumbilical TTP, with palpable hernia). There is no rebound and no guarding.  Musculoskeletal: Normal range of motion. He exhibits no edema or tenderness.  Neurological: He is alert and oriented to person, place, and time. No cranial nerve deficit. He exhibits normal muscle tone. Coordination normal.  Skin: Skin is warm and dry. No rash noted. He is not diaphoretic.  Psychiatric: He has a normal mood and affect. His behavior is normal. Judgment and thought content normal.    ED Course  Procedures (including critical care time) Labs Review Labs Reviewed  CBC - Abnormal; Notable for the following:    WBC 13.9 (*)    All other components within normal limits  URINALYSIS, ROUTINE W REFLEX MICROSCOPIC (NOT AT [facility]) - Abnormal; Notable for the following:    Leukocytes, UA TRACE (*)    All other components within normal limits  URINE MICROSCOPIC-ADD ON - Abnormal; Notable for the following:    Squamous Epithelial / LPF 0-5 (*)    Bacteria, UA RARE (*)    All other components within normal limits  I-STAT CG4 LACTIC ACID, ED - Abnormal; Notable for the following:    Lactic Acid, Venous 2.01 (*)    All other components within normal limits  LIPASE, BLOOD  COMPREHENSIVE METABOLIC PANEL  I-STAT CG4 LACTIC ACID, ED  I-STAT CG4 LACTIC ACID, ED    Imaging Review Ct Abdomen Pelvis W Contrast  01/25/2016  CLINICAL DATA:  Abdominal pain with mid abdominal soft tissue prominence EXAM: CT ABDOMEN AND PELVIS WITH CONTRAST TECHNIQUE: Multidetector CT imaging of the abdomen and pelvis was performed using the standard protocol following bolus administration of intravenous contrast. CONTRAST:  100 mL OMNIPAQUE IOHEXOL 300 MG/ML  SOLN COMPARISON:  None. FINDINGS: Lower chest:   Lung bases are clear. Hepatobiliary: Liver is prominent, measuring 21.1 cm in length. No focal liver lesions are identified. Gallbladder wall is not appreciably thickened. There is no biliary duct dilatation. Pancreas: No pancreatic mass or inflammatory focus. Spleen: No splenic lesions are evident. Adrenals/Urinary Tract: Adrenals appear normal bilaterally. Kidneys bilaterally show no appreciable mass or hydronephrosis on either side. There is no renal or ureteral calculus on either side. Urinary bladder is midline with wall thickness within normal limits. Stomach/Bowel: There is no bowel wall or mesenteric thickening. No bowel obstruction. No free air or portal venous air. Vascular/Lymphatic: There is no abdominal aortic aneurysm. No vascular lesions are evident. No adenopathy is appreciable in the abdomen or pelvis. Reproductive: Prostate and seminal vesicles appear normal. No pelvic mass or pelvic fluid collection. Other: There is a ventral hernia containing fat but no bowel. The neck of the hernia measures 2.6 cm from right to left and 2.4 cm from superior to inferior. The appendix appears normal. There is no abscess or ascites in the abdomen or pelvis. Musculoskeletal: There are no blastic or lytic bone lesions. No intramuscular or abdominal wall lesion evident. IMPRESSION: Ventral hernia containing  only fat as described above. No abscess. No bowel obstruction or bowel wall thickening. Appendix appears normal. No renal or ureteral calculus.  No hydronephrosis. Prominent liver without focal liver lesion apparent. Electronically Signed   By: Bretta BangWilliam  Woodruff III M.D.   On: 01/25/2016 20:41   I have personally reviewed and evaluated these images and lab results as part of my medical decision-making.   EKG Interpretation   Date/Time:  Wednesday January 25 2016 18:02:06 EDT Ventricular Rate:  101 PR Interval:  150 QRS Duration: 93 QT Interval:  327 QTC Calculation: 424 R Axis:   59 Text Interpretation:   Sinus tachycardia No significant change since last  tracing Confirmed by Denton LankSTEINL  MD, Caryn BeeKEVIN ([id number]) on 01/25/2016 10:32:42 PM      MDM   Final diagnoses:  Flu-like symptoms    Patient is generally well-appearing though febrile to 102.5. Generalized tenderness to palpation of the abdomen, worse over his hernia, which is fully reducible. CT abdomen pelvis performed to rule out obstruction that shows no indication of obstruction, bowel wall thickening, or abscess. His hernia contains only adipose tissue. Doubt intra-abdominal process as the cause of his fever.  Lactic acid is initially elevated to 2.01. I suspect that this is in the setting of poor oral intake. Patient was given Zofran and IV fluids with resolution of lactic acidosis. Given his constellation of symptoms, I suspect a viral illness, possibly influenza. Doubt severe bacterial illness including meningitis, pneumonia, strep pharyngitis. I recommend conservative treatment with rest, fluids, Tylenol and Motrin, Zofran for symptomatically treatment. Recommend PCP follow-up in 3 days should symptoms persist and return to the ED for sudden worsening of his abdominal pain or failure to tolerate by mouth intake. Patient expressed agreement and understanding with this plan and will follow up as appropriate.    Ailie Gage Ernestina PennaBrunno Zaryah Seckel, MD 01/25/16 2257  Cathren LaineKevin Steinl, MD 01/26/16 [phone]

## 2016-01-25 NOTE — ED Notes (Signed)
Pt taken to CT.

## 2016-01-25 NOTE — ED Notes (Signed)
Pt sts chills and body aches with pain in abd area near hernia starting today

## 2016-04-09 ENCOUNTER — Encounter: Payer: Managed Care, Other (non HMO) | Attending: General Surgery | Admitting: Skilled Nursing Facility1

## 2016-04-09 ENCOUNTER — Encounter: Payer: Self-pay | Admitting: Skilled Nursing Facility1

## 2016-04-09 DIAGNOSIS — Z01818 Encounter for other preprocedural examination: Secondary | ICD-10-CM | POA: Insufficient documentation

## 2016-04-09 NOTE — Patient Instructions (Signed)
Follow Pre-Op Goals Try Protein Shakes Call [facility] at [phone] when surgery is scheduled to enroll in Pre-Op Class  Things to remember:  Please always be honest with us. We want to support you!  If you have any questions or concerns in between appointments, please call or email Liz, Leslie, or Laurie.  The diet after surgery will be high protein and low in carbohydrate.  Vitamins and calcium need to be taken for the rest of your life.  Feel free to include support people in any classes or appointments.   Supplement recommendations:  Complete" Multivitamin: Sleeve Gastrectomy and RYGB patients take a double dose of MVI. LAGB patients take single dose as it is written on the package. Vitamin must be liquid or chewable but not gummy. Examples of these include Flintstones Complete and Centrum Complete. If the vitamin is bariatric-specific, take 1 dose as it is already formulated for bariatric surgery patients. Examples of these are Bariatric Advantage, Celebrate, and Wellesse. These can be found at the [facility] and/or online.     Calcium citrate: 1500 mg/day of Calcium citrate (also chewable or liquid) is recommended for all procedures. The body is only able to absorb 500-600 mg of Calcium at one time so 3 daily doses of 500 mg are recommended. Calcium doses must be taken a minimum of 2 hours apart. Additionally, Calcium must be taken 2 hours apart from iron-containing MVI. Examples of brands include Celebrate, Bariatric Advantage, and Wellesse. These brands must be purchased online or at the [facility]. Citracal Petites is the only Calcium citrate supplement found in general grocery stores and pharmacies. This is in tablet form and may be recommended for patients who do not tolerate chewable Calcium.  Continued or added Vitamin D supplementation based on individual needs.    Vitamin B12: 300-500 mcg/day for Sleeve Gastrectomy and RYGB. Optional for  LAGB patients as stomach remains fully intact. Must be taken intramuscularly, sublingually, or inhaled nasally. Oral route is not recommended. 

## 2016-04-09 NOTE — Progress Notes (Signed)
  Pre-Op Assessment Visit:  Pre-Operative Sleeve Gastrectomy Surgery  Medical Nutrition Therapy:  Appt start time: 0930   End time:  1030.  Patient was seen on 04/09/2016 for Pre-Operative Nutrition Assessment. Assessment and letter of approval [facility] to [facility] coordinator on 04/09/2016.  Pt states he Doesn't eat that much but he used to drink a substantial amount of soda and beer but since 2-3 months ago he has decreased his alcohol consumption and cut out soda. Preferred Learning Style:   No preference indicated   Learning Readiness:   Ready Handouts given during visit include:  Pre-Op Goals Bariatric Surgery Protein Shakes  During the appointment today the following Pre-Op Goals were reviewed with the patient: Maintain or lose weight as instructed by your surgeon Make healthy food choices Begin to limit portion sizes Limited concentrated sugars and fried foods Keep fat/sugar in the single digits per serving on   food labels Practice CHEWING your food  (aim for 30 chews per bite or until applesauce consistency) Practice not drinking 15 minutes before, during, and 30 minutes after each meal/snack Avoid all carbonated beverages  Avoid/limit caffeinated beverages  Avoid all sugar-sweetened beverages Consume 3 meals per day; eat every 3-5 hours Make a list of non-food related activities Aim for 64-100 ounces of FLUID daily  Aim for at least 60-80 grams of PROTEIN daily Look for a liquid protein source that contain ?15 g protein and ?5 g carbohydrate  (ex: shakes, drinks, shots)  Patient-Centered Goals: 10/10specific/non-scale and confidence/importance scale 1-10  Demonstrated degree of understanding via:  Teach Back  Teaching Method Utilized:  Visual Auditory Hands on  Barriers to learning/adherence to lifestyle change: None identified  [facility] to [facility] the [facility] to enroll in Pre-Op and Post-Op  Nutrition Education when surgery date is scheduled.

## 2016-04-25 ENCOUNTER — Ambulatory Visit: Payer: Managed Care, Other (non HMO) | Admitting: Skilled Nursing Facility1

## 2016-05-15 ENCOUNTER — Other Ambulatory Visit (HOSPITAL_COMMUNITY): Payer: Self-pay | Admitting: General Surgery

## 2016-05-28 ENCOUNTER — Ambulatory Visit: Payer: Managed Care, Other (non HMO) | Admitting: Licensed Clinical Social Worker

## 2016-05-29 ENCOUNTER — Ambulatory Visit: Payer: Managed Care, Other (non HMO) | Admitting: Licensed Clinical Social Worker

## 2016-05-29 ENCOUNTER — Other Ambulatory Visit: Payer: Self-pay

## 2016-05-29 ENCOUNTER — Ambulatory Visit (HOSPITAL_COMMUNITY)
Admission: RE | Admit: 2016-05-29 | Discharge: 2016-05-29 | Disposition: A | Discharge status: Home / Self Care | Payer: Managed Care, Other (non HMO) | Source: Ambulatory Visit | Attending: General Surgery | Admitting: General Surgery

## 2016-05-29 ENCOUNTER — Ambulatory Visit (INDEPENDENT_AMBULATORY_CARE_PROVIDER_SITE_OTHER): Payer: Managed Care, Other (non HMO) | Admitting: Psychiatry

## 2016-05-29 DIAGNOSIS — R0689 Other abnormalities of breathing: Secondary | ICD-10-CM | POA: Diagnosis not present

## 2016-06-05 ENCOUNTER — Ambulatory Visit: Payer: Managed Care, Other (non HMO) | Admitting: Psychiatry

## 2016-06-06 ENCOUNTER — Ambulatory Visit (INDEPENDENT_AMBULATORY_CARE_PROVIDER_SITE_OTHER): Payer: Managed Care, Other (non HMO) | Admitting: Psychiatry

## 2016-09-06 ENCOUNTER — Encounter (HOSPITAL_COMMUNITY): Payer: Self-pay | Admitting: *Deleted

## 2016-09-06 NOTE — Progress Notes (Signed)
Will be scheduling pre op, please place SURGICAL ORDERS in epic  Thanks

## 2016-09-10 ENCOUNTER — Encounter: Payer: Managed Care, Other (non HMO) | Attending: General Surgery | Admitting: Dietician

## 2016-09-10 ENCOUNTER — Ambulatory Visit: Payer: Self-pay | Admitting: General Surgery

## 2016-09-10 DIAGNOSIS — Z6841 Body Mass Index (BMI) 40.0 and over, adult: Secondary | ICD-10-CM | POA: Diagnosis not present

## 2016-09-10 DIAGNOSIS — Z713 Dietary counseling and surveillance: Secondary | ICD-10-CM | POA: Insufficient documentation

## 2016-09-11 ENCOUNTER — Encounter: Payer: Self-pay | Admitting: Dietician

## 2016-09-11 NOTE — Progress Notes (Signed)
  Pre-Operative Nutrition Class:  Appt start time: 7889   End time:  1830.  Patient was seen on 09/10/2016 for Pre-Operative Bariatric Surgery Education at the [facility].   Surgery date: 09/17/2016 Surgery type: Sleeve gastrectomy Start weight at [facility]: 375 lbs on 04/09/2016 Weight today: 398.4 lbs  TANITA  BODY COMP RESULTS  09/11/16   BMI (kg/m^2) 57.2   Fat Mass (lbs) 258.6   Fat Free Mass (lbs) 139.8   Total Body Water (lbs) N/A   Samples given per MNT protocol. Patient educated on appropriate usage: Bariatric Advantage Multivitamin (mixed fruit - qty 1) Lot #: [id number] Exp: 08/2017  Premier protein shake (strawberry - qty 1) Lot #: [id number] Exp: 06/2017  Celebrate Vitamins Calcium Citrate (Cafe Mocha - qty 1) Lot #: [id number]  Exp: 01/2018   The following the learning objectives were met by the patient during this course:  Identify Pre-Op Dietary Goals and will begin 2 weeks pre-operatively  Identify appropriate sources of fluids and proteins   State protein recommendations and appropriate sources pre and post-operatively  Identify Post-Operative Dietary Goals and will follow for 2 weeks post-operatively  Identify appropriate multivitamin and calcium sources  Describe the need for physical activity post-operatively and will follow MD recommendations  State when to call healthcare provider regarding medication questions or post-operative complications  Handouts given during class include:  Pre-Op Bariatric Surgery Diet Handout  Protein Shake Handout  Post-Op Bariatric Surgery Nutrition Handout  BELT Program Information Flyer  Support Group Information Flyer  [facility] Bariatric Supplements Price List  Follow-Up Plan: Patient will follow-up at [facility] 2 weeks post operatively for diet advancement per MD.

## 2016-09-13 NOTE — Patient Instructions (Addendum)
Larry Dean  09/13/2016   Your procedure is scheduled on: 09-17-16  [facility]  [facility]vators to 3rd floor to  Short Stay Center at 515 AM.  Call this number if you have problems the morning of surgery [phone]   Remember: ONLY 1 PERSON MAY GO WITH YOU TO SHORT STAY TO GET  READY MORNING OF YOUR SURGERY.  Do not eat food or drink liquids :After Midnight.   BRING CPAP MASK AND TUBING  Take these medicines the morning of surgery with A SIP OF WATER: NONE                               You may not have any metal on your body including hair pins and              piercings  Do not wear jewelry, make-up, lotions, powders or perfumes, deodorant             Do not wear nail polish.  Do not shave  48 hours prior to surgery.              Men may shave face and neck.   Do not bring valuables to the hospital. [place] IS NOT             RESPONSIBLE   FOR VALUABLES.  Contacts, dentures or bridgework may not be worn into surgery.  Leave suitcase in the car. After surgery it may be brought to your room.                  Please read over the following fact sheets you were given: _____________________________________________________________________             [facility] - Preparing for [facility] Before surgery, you can play an important role.  Because skin is not sterile, your skin needs to be as free of germs as possible.  You can reduce the number of germs on your skin by washing with CHG (chlorahexidine gluconate) soap before surgery.  CHG is an antiseptic cleaner which kills germs and bonds with the skin to continue killing germs even after washing. Please DO NOT use if you have an allergy to CHG or antibacterial soaps.  If your skin becomes reddened/irritated stop using the CHG and inform your nurse when you arrive at Short Stay. Do not shave (including legs and underarms) for at least 48 hours prior to the first CHG shower.   You may shave your face/neck. Please follow these instructions carefully:  1.  Shower with CHG Soap the night before surgery and the  morning of Surgery.  2.  If you choose to wash your hair, wash your hair first as usual with your  normal  shampoo.  3.  After you shampoo, rinse your hair and body thoroughly to remove the  shampoo.                           4.  Use CHG as you would any other liquid soap.  You can apply chg directly  to the skin and wash                       Gently with a scrungie or clean washcloth.  5.  Apply  the CHG Soap to your body ONLY FROM THE NECK DOWN.   Do not use on face/ open                           Wound or open sores. Avoid contact with eyes, ears mouth and genitals (private parts).                       Wash face,  Genitals (private parts) with your normal soap.             6.  Wash thoroughly, paying special attention to the area where your surgery  will be performed.  7.  Thoroughly rinse your body with warm water from the neck down.  8.  DO NOT shower/wash with your normal soap after using and rinsing off  the CHG Soap.                9.  Pat yourself dry with a clean towel.            10.  Wear clean pajamas.            11.  Place clean sheets on your bed the night of your first shower and do not  sleep with pets. Day of Surgery : Do not apply any lotions/deodorants the morning of surgery.  Please wear clean clothes to the hospital/surgery center.  FAILURE TO FOLLOW THESE INSTRUCTIONS MAY RESULT IN THE CANCELLATION OF YOUR SURGERY PATIENT SIGNATURE_________________________________  NURSE SIGNATURE__________________________________  ________________________________________________________________________

## 2016-09-13 NOTE — Progress Notes (Signed)
EKG 05-29-16 EPIC CHEST XRAY 05-29-16 EPIC

## 2016-09-14 ENCOUNTER — Encounter (HOSPITAL_COMMUNITY)
Admission: RE | Admit: 2016-09-14 | Discharge: 2016-09-14 | Disposition: A | Discharge status: Home / Self Care | Payer: Managed Care, Other (non HMO) | Source: Ambulatory Visit | Attending: General Surgery | Admitting: General Surgery

## 2016-09-14 ENCOUNTER — Encounter (HOSPITAL_COMMUNITY): Payer: Self-pay

## 2016-09-14 ENCOUNTER — Encounter (INDEPENDENT_AMBULATORY_CARE_PROVIDER_SITE_OTHER): Payer: Self-pay

## 2016-09-14 DIAGNOSIS — E669 Obesity, unspecified: Secondary | ICD-10-CM | POA: Insufficient documentation

## 2016-09-14 DIAGNOSIS — Z01812 Encounter for preprocedural laboratory examination: Secondary | ICD-10-CM

## 2016-09-14 DIAGNOSIS — Z01818 Encounter for other preprocedural examination: Secondary | ICD-10-CM | POA: Insufficient documentation

## 2016-09-14 HISTORY — DX: Sleep apnea, unspecified: G47.30

## 2016-09-14 HISTORY — DX: Gastro-esophageal reflux disease without esophagitis: K21.9

## 2016-09-14 LAB — COMPREHENSIVE METABOLIC PANEL
ALT: 36 U/L (ref 17–63)
AST: 25 U/L (ref 15–41)
Albumin: 4.6 g/dL (ref 3.5–5.0)
Alkaline Phosphatase: 79 U/L (ref 38–126)
Anion gap: 9 (ref 5–15)
BUN: 14 mg/dL (ref 6–20)
CHLORIDE: 103 mmol/L (ref 101–111)
CO2: 25 mmol/L (ref 22–32)
CREATININE: 0.99 mg/dL (ref 0.61–1.24)
Calcium: 9.5 mg/dL (ref 8.9–10.3)
GFR calc Af Amer: >60 mL/min (ref >60)
Glucose, Bld: 86 mg/dL (ref 65–99)
POTASSIUM: 3.7 mmol/L (ref 3.5–5.1)
SODIUM: 137 mmol/L (ref 135–145)
Total Bilirubin: 0.9 mg/dL (ref 0.3–1.2)
Total Protein: 8.3 g/dL — ABNORMAL HIGH (ref 6.5–8.1)

## 2016-09-14 LAB — CBC WITH DIFFERENTIAL/PLATELET
BASOS ABS: 0 10*3/uL (ref 0.0–0.1)
BASOS PCT: 0 %
EOS ABS: 0 10*3/uL (ref 0.0–0.7)
EOS PCT: 1 %
HCT: 42.2 % (ref 39.0–52.0)
Hemoglobin: 13.9 g/dL (ref 13.0–17.0)
Lymphocytes Relative: 38 %
Lymphs Abs: 2.3 10*3/uL (ref 0.7–4.0)
MCH: 26.7 pg (ref 26.0–34.0)
MCHC: 32.9 g/dL (ref 30.0–36.0)
MCV: 81.2 fL (ref 78.0–100.0)
MONO ABS: 0.6 10*3/uL (ref 0.1–1.0)
Monocytes Relative: 10 %
Neutro Abs: 3.2 10*3/uL (ref 1.7–7.7)
Neutrophils Relative %: 51 %
PLATELETS: 276 10*3/uL (ref 150–400)
RBC: 5.2 MIL/uL (ref 4.22–5.81)
RDW: 13.3 % (ref 11.5–15.5)
WBC: 6.1 10*3/uL (ref 4.0–10.5)

## 2016-09-14 NOTE — Progress Notes (Signed)
BARIMAX BED WITH TRAPEZE ORDERED FROM [facility] IN PORTABLE EQUIPMENT

## 2016-09-17 ENCOUNTER — Inpatient Hospital Stay (HOSPITAL_COMMUNITY)
Admission: RE | Admit: 2016-09-17 | Discharge: 2016-09-19 | DRG: 621 | Disposition: A | Discharge status: Home / Self Care | Payer: Managed Care, Other (non HMO) | Source: Ambulatory Visit | Attending: General Surgery | Admitting: General Surgery

## 2016-09-17 ENCOUNTER — Inpatient Hospital Stay (HOSPITAL_COMMUNITY): Payer: Managed Care, Other (non HMO) | Admitting: Certified Registered Nurse Anesthetist

## 2016-09-17 ENCOUNTER — Encounter (HOSPITAL_COMMUNITY)
Admission: RE | Disposition: A | Discharge status: Home / Self Care | Payer: Self-pay | Source: Ambulatory Visit | Attending: General Surgery

## 2016-09-17 ENCOUNTER — Encounter (HOSPITAL_COMMUNITY): Payer: Self-pay | Admitting: *Deleted

## 2016-09-17 DIAGNOSIS — Z79899 Other long term (current) drug therapy: Secondary | ICD-10-CM

## 2016-09-17 DIAGNOSIS — G4730 Sleep apnea, unspecified: Secondary | ICD-10-CM | POA: Diagnosis present

## 2016-09-17 DIAGNOSIS — R110 Nausea: Secondary | ICD-10-CM | POA: Diagnosis not present

## 2016-09-17 DIAGNOSIS — Z6841 Body Mass Index (BMI) 40.0 and over, adult: Secondary | ICD-10-CM | POA: Diagnosis not present

## 2016-09-17 DIAGNOSIS — K429 Umbilical hernia without obstruction or gangrene: Secondary | ICD-10-CM | POA: Diagnosis present

## 2016-09-17 DIAGNOSIS — I10 Essential (primary) hypertension: Secondary | ICD-10-CM | POA: Diagnosis present

## 2016-09-17 DIAGNOSIS — E669 Obesity, unspecified: Secondary | ICD-10-CM | POA: Diagnosis present

## 2016-09-17 DIAGNOSIS — G8918 Other acute postprocedural pain: Secondary | ICD-10-CM | POA: Diagnosis not present

## 2016-09-17 HISTORY — PX: UPPER GI ENDOSCOPY: SHX6162

## 2016-09-17 HISTORY — PX: LAPAROSCOPIC GASTRIC SLEEVE RESECTION: SHX5895

## 2016-09-17 LAB — CBC
HEMATOCRIT: 40.8 % (ref 39.0–52.0)
Hemoglobin: 13.8 g/dL (ref 13.0–17.0)
MCH: 27 pg (ref 26.0–34.0)
MCHC: 33.8 g/dL (ref 30.0–36.0)
MCV: 79.8 fL (ref 78.0–100.0)
Platelets: 261 10*3/uL (ref 150–400)
RBC: 5.11 MIL/uL (ref 4.22–5.81)
RDW: 13.3 % (ref 11.5–15.5)
WBC: 11.5 10*3/uL — ABNORMAL HIGH (ref 4.0–10.5)

## 2016-09-17 LAB — CREATININE, SERUM
CREATININE: 1.38 mg/dL — AB (ref 0.61–1.24)
GFR calc Af Amer: >60 mL/min (ref >60)

## 2016-09-17 LAB — HEMOGLOBIN AND HEMATOCRIT, BLOOD
HEMATOCRIT: 41.9 % (ref 39.0–52.0)
HEMOGLOBIN: 13.7 g/dL (ref 13.0–17.0)

## 2016-09-17 SURGERY — GASTRECTOMY, SLEEVE, LAPAROSCOPIC
Anesthesia: General | Site: Abdomen

## 2016-09-17 MED ORDER — BUPIVACAINE LIPOSOME 1.3 % IJ SUSP
INTRAMUSCULAR | Status: DC | PRN
  Administered 2016-09-17: 20 mL

## 2016-09-17 MED ORDER — BUPIVACAINE LIPOSOME 1.3 % IJ SUSP
20.0000 mL | Freq: Once | INTRAMUSCULAR | Status: DC
  Filled 2016-09-17: qty 20

## 2016-09-17 MED ORDER — APREPITANT 40 MG PO CAPS
40.0000 mg | ORAL_CAPSULE | ORAL | Status: AC
  Administered 2016-09-17: 40 mg via ORAL
  Filled 2016-09-17: qty 1

## 2016-09-17 MED ORDER — BUPIVACAINE HCL (PF) 0.25 % IJ SOLN
INTRAMUSCULAR | Status: AC
  Filled 2016-09-17: qty 30

## 2016-09-17 MED ORDER — LACTATED RINGERS IV SOLN
INTRAVENOUS | Status: DC | PRN
  Administered 2016-09-17: 07:00:00 via INTRAVENOUS

## 2016-09-17 MED ORDER — EPHEDRINE 5 MG/ML INJ
INTRAVENOUS | Status: AC
  Filled 2016-09-17: qty 10

## 2016-09-17 MED ORDER — PROMETHAZINE HCL 25 MG/ML IJ SOLN: 6.2500 mg | INTRAMUSCULAR | Status: DC | PRN

## 2016-09-17 MED ORDER — SUCCINYLCHOLINE CHLORIDE 200 MG/10ML IV SOSY
PREFILLED_SYRINGE | INTRAVENOUS | Status: DC | PRN
  Administered 2016-09-17: 140 mg via INTRAVENOUS

## 2016-09-17 MED ORDER — EPHEDRINE SULFATE-NACL 50-0.9 MG/10ML-% IV SOSY
PREFILLED_SYRINGE | INTRAVENOUS | Status: DC | PRN
  Administered 2016-09-17: 10 mg via INTRAVENOUS
  Administered 2016-09-17: 5 mg via INTRAVENOUS
  Administered 2016-09-17: 10 mg via INTRAVENOUS

## 2016-09-17 MED ORDER — BUPIVACAINE HCL 0.25 % IJ SOLN
INTRAMUSCULAR | Status: DC | PRN
  Administered 2016-09-17: 50 mL

## 2016-09-17 MED ORDER — SUCCINYLCHOLINE CHLORIDE 200 MG/10ML IV SOSY
PREFILLED_SYRINGE | INTRAVENOUS | Status: AC
  Filled 2016-09-17: qty 10

## 2016-09-17 MED ORDER — KETAMINE HCL 10 MG/ML IJ SOLN
INTRAMUSCULAR | Status: DC | PRN
  Administered 2016-09-17: 40 mg via INTRAVENOUS

## 2016-09-17 MED ORDER — ACETAMINOPHEN 160 MG/5ML PO SOLN: 650.0000 mg | ORAL | Status: DC | PRN

## 2016-09-17 MED ORDER — SUGAMMADEX SODIUM 500 MG/5ML IV SOLN
INTRAVENOUS | Status: AC
  Filled 2016-09-17: qty 10

## 2016-09-17 MED ORDER — HYDROMORPHONE HCL 1 MG/ML IJ SOLN: 0.2500 mg | INTRAMUSCULAR | Status: DC | PRN

## 2016-09-17 MED ORDER — SODIUM CHLORIDE 0.9 % IV SOLN
INTRAVENOUS | Status: DC
  Administered 2016-09-17 – 2016-09-19 (×4): via INTRAVENOUS

## 2016-09-17 MED ORDER — SCOPOLAMINE 1 MG/3DAYS TD PT72
MEDICATED_PATCH | TRANSDERMAL | Status: AC
  Filled 2016-09-17: qty 1

## 2016-09-17 MED ORDER — FENTANYL CITRATE (PF) 250 MCG/5ML IJ SOLN
INTRAMUSCULAR | Status: AC
  Filled 2016-09-17: qty 5

## 2016-09-17 MED ORDER — ONDANSETRON HCL 4 MG/2ML IJ SOLN
4.0000 mg | INTRAMUSCULAR | Status: DC | PRN
  Administered 2016-09-19: 4 mg via INTRAVENOUS
  Filled 2016-09-17: qty 2

## 2016-09-17 MED ORDER — DEXAMETHASONE SODIUM PHOSPHATE 10 MG/ML IJ SOLN
INTRAMUSCULAR | Status: AC
  Filled 2016-09-17: qty 1

## 2016-09-17 MED ORDER — ACETAMINOPHEN 10 MG/ML IV SOLN
INTRAVENOUS | Status: DC | PRN
  Administered 2016-09-17: 1000 mg via INTRAVENOUS

## 2016-09-17 MED ORDER — METOCLOPRAMIDE HCL 5 MG/ML IJ SOLN
INTRAMUSCULAR | Status: AC
  Filled 2016-09-17: qty 2

## 2016-09-17 MED ORDER — PROPOFOL 10 MG/ML IV BOLUS
INTRAVENOUS | Status: AC
  Filled 2016-09-17: qty 20

## 2016-09-17 MED ORDER — LACTATED RINGERS IR SOLN
Status: DC | PRN
  Administered 2016-09-17: 3000 mL

## 2016-09-17 MED ORDER — KETAMINE HCL 10 MG/ML IJ SOLN
INTRAMUSCULAR | Status: AC
  Filled 2016-09-17: qty 1

## 2016-09-17 MED ORDER — MORPHINE SULFATE (PF) 2 MG/ML IV SOLN
2.0000 mg | INTRAVENOUS | Status: DC | PRN
  Administered 2016-09-17 (×2): 6 mg via INTRAVENOUS
  Administered 2016-09-17: 4 mg via INTRAVENOUS
  Administered 2016-09-17: 2 mg via INTRAVENOUS
  Administered 2016-09-17: 6 mg via INTRAVENOUS
  Filled 2016-09-17 (×2): qty 3
  Filled 2016-09-17: qty 2
  Filled 2016-09-17: qty 3
  Filled 2016-09-17: qty 1

## 2016-09-17 MED ORDER — ONDANSETRON HCL 4 MG/2ML IJ SOLN
INTRAMUSCULAR | Status: DC | PRN
  Administered 2016-09-17: 4 mg via INTRAVENOUS

## 2016-09-17 MED ORDER — ENOXAPARIN SODIUM 30 MG/0.3ML ~~LOC~~ SOLN
30.0000 mg | Freq: Two times a day (BID) | SUBCUTANEOUS | Status: DC
  Administered 2016-09-18 – 2016-09-19 (×3): 30 mg via SUBCUTANEOUS
  Filled 2016-09-17 (×3): qty 0.3

## 2016-09-17 MED ORDER — HYDROMORPHONE HCL 1 MG/ML IJ SOLN
0.2500 mg | INTRAMUSCULAR | Status: DC | PRN
  Administered 2016-09-17 (×2): 0.25 mg via INTRAVENOUS

## 2016-09-17 MED ORDER — FENTANYL CITRATE (PF) 100 MCG/2ML IJ SOLN
INTRAMUSCULAR | Status: DC | PRN
  Administered 2016-09-17: 150 ug via INTRAVENOUS
  Administered 2016-09-17 (×2): 25 ug via INTRAVENOUS

## 2016-09-17 MED ORDER — ACETAMINOPHEN 160 MG/5ML PO SOLN: 325.0000 mg | ORAL | Status: DC | PRN

## 2016-09-17 MED ORDER — DEXAMETHASONE SODIUM PHOSPHATE 4 MG/ML IJ SOLN
INTRAMUSCULAR | Status: DC | PRN
  Administered 2016-09-17: 10 mg via INTRAVENOUS

## 2016-09-17 MED ORDER — MIDAZOLAM HCL 5 MG/5ML IJ SOLN
INTRAMUSCULAR | Status: DC | PRN
  Administered 2016-09-17 (×2): 0.5 mg via INTRAVENOUS

## 2016-09-17 MED ORDER — HYDROMORPHONE HCL 1 MG/ML IJ SOLN
INTRAMUSCULAR | Status: AC
  Administered 2016-09-17: 0.25 mg via INTRAVENOUS
  Filled 2016-09-17: qty 1

## 2016-09-17 MED ORDER — 0.9 % SODIUM CHLORIDE (POUR BTL) OPTIME
TOPICAL | Status: DC | PRN
  Administered 2016-09-17: 1000 mL

## 2016-09-17 MED ORDER — PROPOFOL 10 MG/ML IV BOLUS
INTRAVENOUS | Status: DC | PRN
  Administered 2016-09-17: 290 mg via INTRAVENOUS

## 2016-09-17 MED ORDER — PHENYLEPHRINE 40 MCG/ML (10ML) SYRINGE FOR IV PUSH (FOR BLOOD PRESSURE SUPPORT)
PREFILLED_SYRINGE | INTRAVENOUS | Status: DC | PRN
  Administered 2016-09-17 (×4): 80 ug via INTRAVENOUS
  Administered 2016-09-17: 120 ug via INTRAVENOUS
  Administered 2016-09-17: 80 ug via INTRAVENOUS
  Administered 2016-09-17 (×2): 120 ug via INTRAVENOUS

## 2016-09-17 MED ORDER — ONDANSETRON HCL 4 MG/2ML IJ SOLN
INTRAMUSCULAR | Status: AC
  Filled 2016-09-17: qty 2

## 2016-09-17 MED ORDER — LIDOCAINE 2% (20 MG/ML) 5 ML SYRINGE
INTRAMUSCULAR | Status: DC | PRN
  Administered 2016-09-17: 1.5 mg/kg/h via INTRAVENOUS

## 2016-09-17 MED ORDER — ROCURONIUM BROMIDE 50 MG/5ML IV SOSY
PREFILLED_SYRINGE | INTRAVENOUS | Status: AC
  Filled 2016-09-17: qty 10

## 2016-09-17 MED ORDER — METOCLOPRAMIDE HCL 5 MG/ML IJ SOLN
INTRAMUSCULAR | Status: DC | PRN
  Administered 2016-09-17: 10 mg via INTRAVENOUS

## 2016-09-17 MED ORDER — PREMIER PROTEIN SHAKE
2.0000 [oz_av] | ORAL | Status: DC
  Administered 2016-09-19 (×5): 2 [oz_av] via ORAL

## 2016-09-17 MED ORDER — LIDOCAINE 2% (20 MG/ML) 5 ML SYRINGE
INTRAMUSCULAR | Status: DC | PRN
  Administered 2016-09-17: 100 mg via INTRAVENOUS

## 2016-09-17 MED ORDER — ACETAMINOPHEN 10 MG/ML IV SOLN
1000.0000 mg | Freq: Four times a day (QID) | INTRAVENOUS | Status: AC
  Administered 2016-09-17 – 2016-09-18 (×4): 1000 mg via INTRAVENOUS
  Filled 2016-09-17 (×4): qty 100

## 2016-09-17 MED ORDER — CHLORHEXIDINE GLUCONATE 4 % EX LIQD: 60.0000 mL | Freq: Once | CUTANEOUS | Status: DC

## 2016-09-17 MED ORDER — PHENYLEPHRINE 40 MCG/ML (10ML) SYRINGE FOR IV PUSH (FOR BLOOD PRESSURE SUPPORT)
PREFILLED_SYRINGE | INTRAVENOUS | Status: AC
  Filled 2016-09-17: qty 10

## 2016-09-17 MED ORDER — SCOPOLAMINE 1 MG/3DAYS TD PT72
MEDICATED_PATCH | TRANSDERMAL | Status: DC | PRN
  Administered 2016-09-17: 1 via TRANSDERMAL

## 2016-09-17 MED ORDER — SUGAMMADEX SODIUM 200 MG/2ML IV SOLN
INTRAVENOUS | Status: DC | PRN
  Administered 2016-09-17: 700 mg via INTRAVENOUS

## 2016-09-17 MED ORDER — MIDAZOLAM HCL 2 MG/2ML IJ SOLN
INTRAMUSCULAR | Status: AC
  Filled 2016-09-17: qty 2

## 2016-09-17 MED ORDER — LIDOCAINE 2% (20 MG/ML) 5 ML SYRINGE
INTRAMUSCULAR | Status: AC
  Filled 2016-09-17: qty 10

## 2016-09-17 MED ORDER — CEFOTETAN DISODIUM-DEXTROSE 2-2.08 GM-% IV SOLR
2.0000 g | INTRAVENOUS | Status: AC
  Administered 2016-09-17: 2 g via INTRAVENOUS

## 2016-09-17 MED ORDER — ROCURONIUM BROMIDE 50 MG/5ML IV SOSY
PREFILLED_SYRINGE | INTRAVENOUS | Status: DC | PRN
  Administered 2016-09-17 (×2): 10 mg via INTRAVENOUS
  Administered 2016-09-17: 20 mg via INTRAVENOUS
  Administered 2016-09-17: 50 mg via INTRAVENOUS

## 2016-09-17 MED ORDER — LIDOCAINE 2% (20 MG/ML) 5 ML SYRINGE
INTRAMUSCULAR | Status: AC
  Filled 2016-09-17: qty 5

## 2016-09-17 MED ORDER — GLYCOPYRROLATE 0.2 MG/ML IV SOSY
PREFILLED_SYRINGE | INTRAVENOUS | Status: AC
  Filled 2016-09-17: qty 3

## 2016-09-17 MED ORDER — GLYCOPYRROLATE 0.2 MG/ML IJ SOLN
INTRAMUSCULAR | Status: DC | PRN
  Administered 2016-09-17: 0.1 mg via INTRAVENOUS

## 2016-09-17 MED ORDER — BUPIVACAINE HCL (PF) 0.5 % IJ SOLN
INTRAMUSCULAR | Status: AC
  Filled 2016-09-17: qty 30

## 2016-09-17 MED ORDER — HEPARIN SODIUM (PORCINE) 5000 UNIT/ML IJ SOLN
5000.0000 [IU] | INTRAMUSCULAR | Status: AC
  Administered 2016-09-17: 5000 [IU] via SUBCUTANEOUS
  Filled 2016-09-17: qty 1

## 2016-09-17 MED ORDER — OXYCODONE HCL 5 MG/5ML PO SOLN
5.0000 mg | ORAL | Status: DC | PRN
  Administered 2016-09-18: 10 mg via ORAL
  Administered 2016-09-18: 5 mg via ORAL
  Administered 2016-09-18: 10 mg via ORAL
  Administered 2016-09-18: 5 mg via ORAL
  Administered 2016-09-19: 10 mg via ORAL
  Filled 2016-09-17: qty 10
  Filled 2016-09-17: qty 5
  Filled 2016-09-17 (×2): qty 10
  Filled 2016-09-17: qty 5

## 2016-09-17 SURGICAL SUPPLY — 58 items
APPLIER CLIP 5 13 M/L LIGAMAX5 (MISCELLANEOUS)
APPLIER CLIP ROT 10 11.4 M/L (STAPLE)
APPLIER CLIP ROT 13.4 12 LRG (CLIP)
BLADE SURG SZ11 CARB STEEL (BLADE) ×4 IMPLANT
CABLE HIGH FREQUENCY MONO STRZ (ELECTRODE) ×4 IMPLANT
CHLORAPREP W/TINT 26ML (MISCELLANEOUS) ×8 IMPLANT
CLIP APPLIE 5 13 M/L LIGAMAX5 (MISCELLANEOUS) IMPLANT
CLIP APPLIE ROT 10 11.4 M/L (STAPLE) IMPLANT
CLIP APPLIE ROT 13.4 12 LRG (CLIP) IMPLANT
COVER SURGICAL LIGHT HANDLE (MISCELLANEOUS) IMPLANT
DERMABOND ADVANCED (GAUZE/BANDAGES/DRESSINGS) ×4
DERMABOND ADVANCED .7 DNX12 (GAUZE/BANDAGES/DRESSINGS) ×4 IMPLANT
DRAIN CHANNEL 19F RND (DRAIN) IMPLANT
ELECT REM PT RETURN 9FT ADLT (ELECTROSURGICAL) ×4
ELECTRODE REM PT RTRN 9FT ADLT (ELECTROSURGICAL) ×2 IMPLANT
EVACUATOR SILICONE 100CC (DRAIN) IMPLANT
GAUZE SPONGE 4X4 12PLY STRL (GAUZE/BANDAGES/DRESSINGS) IMPLANT
GLOVE BIOGEL PI IND STRL 7.0 (GLOVE) ×2 IMPLANT
GLOVE BIOGEL PI INDICATOR 7.0 (GLOVE) ×2
GLOVE SURG SS PI 7.0 STRL IVOR (GLOVE) ×4 IMPLANT
GOWN STRL REUS W/TWL LRG LVL3 (GOWN DISPOSABLE) ×4 IMPLANT
GOWN STRL REUS W/TWL XL LVL3 (GOWN DISPOSABLE) ×12 IMPLANT
GRASPER SUT TROCAR 14GX15 (MISCELLANEOUS) ×4 IMPLANT
HANDLE STAPLE EGIA 4 XL (STAPLE) ×4 IMPLANT
HOVERMATT SINGLE USE (MISCELLANEOUS) ×4 IMPLANT
IRRIG SUCT STRYKERFLOW 2 WTIP (MISCELLANEOUS) ×4
IRRIGATION SUCT STRKRFLW 2 WTP (MISCELLANEOUS) ×2 IMPLANT
KIT BASIN OR (CUSTOM PROCEDURE TRAY) ×4 IMPLANT
MARKER SKIN DUAL TIP RULER LAB (MISCELLANEOUS) ×4 IMPLANT
NEEDLE SPNL 22GX3.5 QUINCKE BK (NEEDLE) ×4 IMPLANT
PACK CARDIOVASCULAR III (CUSTOM PROCEDURE TRAY) ×4 IMPLANT
POUCH SPECIMEN RETRIEVAL 10MM (ENDOMECHANICALS) ×4 IMPLANT
RELOAD EGIA 45 MED/THCK PURPLE (STAPLE) IMPLANT
RELOAD TRI 45 ART MED THCK BLK (STAPLE) IMPLANT
RELOAD TRI 45 ART MED THCK PUR (STAPLE) IMPLANT
RELOAD TRI 60 ART MED THCK BLK (STAPLE) ×12 IMPLANT
RELOAD TRI 60 ART MED THCK PUR (STAPLE) ×8 IMPLANT
SCISSORS LAP 5X45 EPIX DISP (ENDOMECHANICALS) ×4 IMPLANT
SHEARS HARMONIC ACE PLUS 45CM (MISCELLANEOUS) ×4 IMPLANT
SLEEVE GASTRECTOMY 40FR VISIGI (MISCELLANEOUS) ×4 IMPLANT
SLEEVE XCEL OPT CAN 5 100 (ENDOMECHANICALS) ×12 IMPLANT
SOLUTION ANTI FOG 6CC (MISCELLANEOUS) ×4 IMPLANT
SPONGE LAP 18X18 X RAY DECT (DISPOSABLE) ×4 IMPLANT
SUT ETHIBOND 0 36 GRN (SUTURE) IMPLANT
SUT ETHILON 2 0 PS N (SUTURE) IMPLANT
SUT MNCRL AB 4-0 PS2 18 (SUTURE) ×4 IMPLANT
SUT SILK 0 SH 30 (SUTURE) IMPLANT
SUT VICRYL 0 TIES 12 18 (SUTURE) ×4 IMPLANT
SYR 20CC LL (SYRINGE) ×4 IMPLANT
SYR 50ML LL SCALE MARK (SYRINGE) ×4 IMPLANT
TOWEL OR 17X26 10 PK STRL BLUE (TOWEL DISPOSABLE) ×4 IMPLANT
TOWEL OR NON WOVEN STRL DISP B (DISPOSABLE) ×4 IMPLANT
TROCAR BLADELESS 15MM (ENDOMECHANICALS) ×4 IMPLANT
TROCAR BLADELESS OPT 5 100 (ENDOMECHANICALS) ×4 IMPLANT
TUBING CONNECTING 10 (TUBING) ×3 IMPLANT
TUBING CONNECTING 10' (TUBING) ×1
TUBING ENDO SMARTCAP (MISCELLANEOUS) ×4 IMPLANT
TUBING INSUF HEATED (TUBING) ×4 IMPLANT

## 2016-09-17 NOTE — Op Note (Signed)
Name:  Larry Dean MRN: [id number] Date of Surgery: 09/17/2016  Preop Diagnosis:  Morbid Obesity  Postop Diagnosis:  Morbid Obesity (Weight - 400, BMI - 57.4), S/P Gastric Sleeve  Procedure:  Upper endoscopy  (Intraoperative)  Surgeon:  Ovidio Kinavid Estle Huguley, M.D.  Anesthesia:  GET  Indications for procedure: Larry Dean is a 35 y.o. male whose primary care physician is Verlon AuBoyd, Tammy Lamonica, MD and has completed a Gastric Sleeve today by Dr. Sheliah HatchKinsinger.  I am doing an intraoperative upper endoscopy to evaluate the gastric pouch.  Operative Note: The patient is under general anesthesia.  Dr. Sheliah HatchKinsinger is laparoscoping the patient while I do an upper endoscopy to evaluate the stomach pouch.  With the patient intubated, I passed the Pentax upper endoscope without difficulty down the esophagus.  The esophago-gastric junction was at 43 cm.    The mucosa of the stomach looked viable and the staple line was intact without bleeding.  I advanced to the pylorus, but did not go through it.  While I insufflated the stomach pouch with air, Dr. Sheliah HatchKinsinger flooded the upper abdomen with saline to put the gastric pouch under saline.  There was no bubbling or evidence of a leak.  There was no evidence of narrowing of the pouch and the gastric sleeve looked tubular.  The scope was then withdrawn.  The esophagus was unremarkable and the patient tolerated the endoscopy without difficulty.  Ovidio Kinavid Tenecia Ignasiak, MD, [facility] Pager: [phone] Office phone:  [phone]

## 2016-09-17 NOTE — H&P (Addendum)
History of Present Illness Larry Dean(Martel Galvan A. Novie Maggio MD; 09/07/2016 9:40 AM) The patient is a 35 year old male who presents for a bariatric surgery evaluation. The patient has completed all required workup for sleeve gastrectomy. He has worked considerably on his diet to decrease white breads and sugared beverages. He also continues to drink sugary beverages (gatorade) occasionally.   Allergies (Sonya Bynum, CMA; 09/07/2016 9:12 AM) No Known Drug Allergies05/31/2017  Medication History (Sonya Bynum, CMA; 09/07/2016 9:12 AM) Irbesartan-Hydrochlorothiazide (150-12.5MG  Tablet, Oral) Active. Albuterol Sulfate (108 (90 Base)MCG/ACT Aero Pow Br Act, Inhalation) Active. Medications Reconciled Valsartan (160MG  Tablet, Oral) Active.    Review of Systems Larry Dean(Auriah Hollings A. Nima Kemppainen MD; 09/07/2016 9:40 AM) General Not Present- Appetite Loss, Chills, Fatigue, Fever, Night Sweats, Weight Gain and Weight Loss. Skin Not Present- Change in Wart/Mole, Dryness, Hives, Jaundice, New Lesions, Non-Healing Wounds, Rash and Ulcer. HEENT Not Present- Earache, Hearing Loss, Hoarseness, Nose Bleed, Oral Ulcers, Ringing in the Ears, Seasonal Allergies, Sinus Pain, Sore Throat, Visual Disturbances, Wears glasses/contact lenses and Yellow Eyes. Respiratory Present- Snoring. Not Present- Bloody sputum, Chronic Cough, Difficulty Breathing and Wheezing. Breast Not Present- Breast Mass, Breast Pain, Nipple Discharge and Skin Changes. Cardiovascular Not Present- Chest Pain, Difficulty Breathing Lying Down, Leg Cramps, Palpitations, Rapid Heart Rate, Shortness of Breath and Swelling of Extremities. Gastrointestinal Not Present- Abdominal Pain, Bloating, Bloody Stool, Change in Bowel Habits, Chronic diarrhea, Constipation, Difficulty Swallowing, Excessive gas, Gets full quickly at meals, Hemorrhoids, Indigestion, Nausea, Rectal Pain and Vomiting. Male Genitourinary Not Present- Blood in Urine, Change in Urinary Stream, Frequency,  Impotence, Nocturia, Painful Urination, Urgency and Urine Leakage. Musculoskeletal Present- Back Pain. Not Present- Joint Pain, Joint Stiffness, Muscle Pain, Muscle Weakness and Swelling of Extremities. Neurological Not Present- Decreased Memory, Fainting, Headaches, Numbness, Seizures, Tingling, Tremor, Trouble walking and Weakness. Psychiatric Not Present- Anxiety, Bipolar, Change in Sleep Pattern, Depression, Fearful and Frequent crying. Endocrine Not Present- Cold Intolerance, Excessive Hunger, Hair Changes, Heat Intolerance and New Diabetes. Hematology Not Present- Easy Bruising, Excessive bleeding, Gland problems, HIV and Persistent Infections.  Vitals (Sonya Bynum CMA; 09/07/2016 9:12 AM) 09/07/2016 9:12 AM Weight: 400 lb Height: 70in Body Surface Area: 2.8 m Body Mass Index: 57.39 kg/m  Temp.: 53F(Temporal)  Pulse: 81 (Regular)  BP: 144/82 (Sitting, Left Arm, Standard)       Physical Exam Larry Dean(Shamiracle Gorden A Zymere Patlan, MD; 09/07/2016 9:41 AM) General Mental Status-Alert. General Appearance-Cooperative. Orientation-Oriented X4. Build & Nutrition-Obese. Posture-Normal posture.  Integumentary Global Assessment Normal Exam - Head/Face: no rashes, ulcers, lesions or evidence of photo damage. No palpable nodules or masses and Neck: no visible lesions or palpable masses.  Head and Neck Head-normocephalic, atraumatic with no lesions or palpable masses. Face Global Assessment - atraumatic. Thyroid Gland Characteristics - normal size and consistency.  Eye Eyeball - Bilateral-Extraocular movements intact. Sclera/Conjunctiva - Bilateral-No scleral icterus, No Discharge.  ENMT Nose and Sinuses Nose - no deformities observed, no swelling present.  Chest and Lung Exam Palpation Normal exam - Non-tender. Auscultation Breath sounds - Normal.  Cardiovascular Auscultation Rhythm - Regular. Heart Sounds - S1 WNL and S2 WNL. Carotid arteries - No Carotid  bruit.  Abdomen Inspection Normal Exam - No Visible peristalsis, No Abnormal pulsations and No Paradoxical movements. Palpation/Percussion Normal exam - Soft, Non Tender, No Rebound tenderness, No Rigidity (guarding), No hepatosplenomegaly and No Palpable abdominal masses.  Peripheral Vascular Upper Extremity Palpation - Pulses bilaterally normal. Lower Extremity Palpation - Edema - Bilateral - No edema.  Neurologic Neurologic evaluation reveals -normal sensation and normal  coordination.  Neuropsychiatric Mental status exam performed with findings of-able to articulate well with normal speech/language, rate, volume and coherence and thought content normal with ability to perform basic computations and apply abstract reasoning.  Musculoskeletal Normal Exam - Bilateral-Upper Extremity Strength Normal and Lower Extremity Strength Normal.    Assessment & Plan Larry Dean(Cathlyn Tersigni A. Shaine Mount MD; 09/07/2016 9:41 AM) MORBID OBESITY (E66.01) Story: 35 yo male interested in sleeve. No reflux or dysphagia, no previous abdominal surgeries, only medication is for hypertension.  He still has some changes to make him a dietary front. We discussed this at length to ensure that he understood that these changes are required for him to have long-term success. Impression: We again discussed the details of the operation: Will be done under general anesthesia with an endotracheal tube, that it will be a laparoscopic procedure with 6 small incisions large one being 1.5-2in, that we will remove remove the short gastrics and other vessels to the greater curve of the stomach, place a large Bougie down the stomach and then remove a percent of the stomach using a linear stapler. We discussed the procedure will take approximately 1.5-2 hours. We discussed risks of VTE, staple line leak, skin infection, sleeve stenosis, incisional hernia, need for open procedure, and postoperative nausea and vomiting.

## 2016-09-17 NOTE — Transfer of Care (Signed)
Immediate Anesthesia Transfer of Care Note  Patient: Larry Dean  Procedure(s) Performed: Procedure(s): LAPAROSCOPIC GASTRIC SLEEVE RESECTION WITH UPPPER ENDO (N/A) UPPER GI ENDOSCOPY  Patient Location: PACU  Anesthesia Type:General  Level of Consciousness: Patient easily awoken, sedated, comfortable, cooperative, following commands, responds to stimulation.   Airway & Oxygen Therapy: Patient spontaneously breathing, ventilating well, oxygen via simple oxygen mask.  Post-op Assessment: Report given to PACU RN, vital signs reviewed and stable, moving all extremities.   Post vital signs: Reviewed and stable.  Complications: No apparent anesthesia complications  Last Vitals:  Vitals:   09/17/16 0602  BP: 134/77  Pulse: 79  Resp: 18  Temp: 36.6 C    Last Pain:  Vitals:   09/17/16 0602  TempSrc: Oral      Patients Stated Pain Goal: 3 (09/17/16 0551)  Complications: No apparent anesthesia complications

## 2016-09-17 NOTE — Anesthesia Procedure Notes (Signed)
Procedure Name: Intubation Date/Time: 09/17/2016 7:33 AM Performed by: Ludwig LeanJONES, Larry Dean: Patient identified, Emergency Drugs available, Suction available and Patient being monitored Patient Re-evaluated:Patient Re-evaluated prior to inductionOxygen Delivery Method: Circle system utilized Preoxygenation: Pre-oxygenation with 100% oxygen Intubation Type: IV induction Ventilation: Mask ventilation without difficulty, Two handed mask ventilation required and Oral airway inserted - appropriate to patient size Laryngoscope Size: Glidescope and 4 Grade View: Grade I Tube type: Oral Tube size: 8.0 mm Number of attempts: 1 (24 at the teeth) Airway Equipment and Method: Oral airway,  Rigid stylet and Video-laryngoscopy Placement Confirmation: ETT inserted through vocal cords under direct vision,  positive ETCO2 and breath sounds checked- equal and bilateral Secured at: 24 (at the teeth) cm Tube secured with: Tape Dental Injury: Teeth and Oropharynx as per pre-operative assessment  Difficulty Due To: Difficulty was anticipated and Difficult Airway- due to large tongue Future Recommendations: Recommend- induction with short-acting agent, and alternative techniques readily available

## 2016-09-17 NOTE — Discharge Instructions (Signed)
° ° ° °GASTRIC BYPASS/SLEEVE ° Home Care Instructions ° ° These instructions are to help you care for yourself when you go home. ° °Call: If you have any problems. °• Call [phone] and ask for the surgeon on call °• If you need immediate assistance come to the ER at [facility]. Tell the ER staff you are a new post-op gastric bypass or gastric sleeve patient  °Signs and symptoms to report: • Severe  vomiting or nausea °o If you cannot handle clear liquids for longer than 1 day, call your surgeon °• Abdominal pain which does not get better after taking your pain medication °• Fever greater than 100.4°  F and chills °• Heart rate over 100 beats a minute °• Trouble breathing °• Chest pain °• Redness,  swelling, drainage, or foul odor at incision (surgical) sites °• If your incisions open or pull apart °• Swelling or pain in calf (lower leg) °• Diarrhea (Loose bowel movements that happen often), frequent watery, uncontrolled bowel movements °• Constipation, (no bowel movements for 3 days) if this happens: °o Take Milk of Magnesia, 2 tablespoons by mouth, 3 times a day for 2 days if needed °o Stop taking Milk of Magnesia once you have had a bowel movement °o Call your doctor if constipation continues °Or °o Take Miralax  (instead of Milk of Magnesia) following the label instructions °o Stop taking Miralax once you have had a bowel movement °o Call your doctor if constipation continues °• Anything you think is “abnormal for you” °  °Normal side effects after surgery: • Unable to sleep at night or unable to concentrate °• Irritability °• Being tearful (crying) or depressed ° °These are common complaints, possibly related to your anesthesia, stress of surgery, and change in lifestyle, that usually go away a few weeks after surgery. If these feelings continue, call your medical doctor.  °Wound Care: You may have surgical glue, steri-strips, or staples over your incisions after surgery °• Surgical glue: Looks like clear  film over your incisions and will wear off a little at a time °• Steri-strips: Adhesive strips of tape over your incisions. You may notice a yellowish color on skin under the steri-strips. This is used to make the steri-strips stick better. Do not pull the steri-strips off - let them fall off °• Staples: Staples may be removed before you leave the hospital °o If you go home with staples, call [facility] for an appointment with your surgeon’s nurse to have staples removed 10 days after surgery, [phone] °• Showering: You may shower two (2) days after your surgery unless your surgeon tells you differently °o Wash gently around incisions with warm soapy water, rinse well, and gently pat dry °o If you have a drain (tube from your incision), you may need someone to hold this while you shower °o No tub baths until staples are removed and incisions are healed °  °Medications: • Medications should be liquid or crushed if larger than the size of a dime °• Extended release pills (medication that releases a little bit at a time through the  day) should not be crushed °• Depending on the size and number of medications you take, you may need to space (take a few throughout the day)/change the time you take your medications so that you do not over-fill your pouch (smaller stomach) °• Make sure you follow-up with you primary care physician to make medication changes needed during rapid weight loss and life -style changes °•   If you have diabetes, follow up with your doctor that orders your diabetes medication(s) within one week after surgery and check your blood sugar regularly ° °• Do not drive while taking narcotics (pain medications) ° °• Do not take acetaminophen (Tylenol) and Roxicet or Lortab Elixir at the same time since these pain medications contain acetaminophen °  °Diet:  °First 2 Weeks You will see the nutritionist about two (2) weeks after your surgery. The nutritionist will increase the types of  foods you can eat if you are handling liquids well: °• If you have severe vomiting or nausea and cannot handle clear liquids lasting longer than 1 day call your surgeon °Protein Shake °• Drink at least 2 ounces of shake 5-6 times per day °• Each serving of protein shakes (usually 8-12 ounces) should have a minimum of: °o 15 grams of protein °o And no more than 5 grams of carbohydrate °• Goal for protein each day: °o Men = 80 grams per day °o Women = 60 grams per day °  ° • Protein powder may be added to fluids such as non-fat milk or Lactaid milk or Soy milk (limit to 35 grams added protein powder per serving) ° °Hydration °• Slowly increase the amount of water and other clear liquids as tolerated (See Acceptable Fluids) °• Slowly increase the amount of protein shake as tolerated °• Sip fluids slowly and throughout the day °• May use sugar substitutes in small amounts (no more than 6-8 packets per day; i.e. Splenda) ° °Fluid Goal °• The first goal is to drink at least 8 ounces of protein shake/drink per day (or as directed by the nutritionist); some examples of protein shakes are Syntrax Nectar, Adkins Advantage, EAS Edge HP, and Unjury. - See handout from pre-op Bariatric Education Class: °o Slowly increase the amount of protein shake you drink as tolerated °o You may find it easier to slowly sip shakes throughout the day °o It is important to get your proteins in first °• Your fluid goal is to drink 64-100 ounces of fluid daily °o It may take a few weeks to build up to this  °• 32 oz. (or more) should be clear liquids °And °• 32 oz. (or more) should be full liquids (see below for examples) °• Liquids should not contain sugar, caffeine, or carbonation ° °Clear Liquids: °• Water of Sugar-free flavored water (i.e. Fruit H²O, Propel) °• Decaffeinated coffee or tea (sugar-free) °• Crystal lite, Wyler’s Lite, Minute Maid Lite °• Sugar-free Jell-O °• Bouillon or broth °• Sugar-free Popsicle:    - Less than 20 calories  each; Limit 1 per day ° °Full Liquids: °                  Protein Shakes/Drinks + 2 choices per day of other full liquids °• Full liquids must be: °o No More Than 12 grams of Carbs per serving °o No More Than 3 grams of Fat per serving °• Strained low-fat cream soup °• Non-Fat milk °• Fat-free Lactaid Milk °• Sugar-free yogurt (Dannon Lite & Fit, Greek yogurt) ° °  °Vitamins and Minerals • Start 1 day after surgery unless otherwise directed by your surgeon °• 2 Chewable Multivitamin / Multimineral Supplement with iron (i.e. Centrum for Adults) °• Vitamin B-12, 350-500 micrograms sub-lingual (place tablet under the tongue) each day °• Chewable Calcium Citrate with Vitamin D-3 °(Example: 3 Chewable Calcium  Plus 600 with Vitamin D-3) °o Take 500 mg three (3) times a day for   a total of 1500 mg each day °o Do not take all 3 doses of calcium at one time as it may cause constipation, and you can only absorb 500 mg at a time °o Do not mix multivitamins containing iron with calcium supplements;  take 2 hours apart °o Do not substitute Tums (calcium carbonate) for your calcium °• Menstruating women and those at risk for anemia ( a blood disease that causes weakness) may need extra iron °o Talk to your doctor to see if you need more iron °• If you need extra iron: Total daily Iron recommendation (including Vitamins) is 50 to 100 mg Iron/day °• Do not stop taking or change any vitamins or minerals until you talk to your nutritionist or surgeon °• Your nutritionist and/or surgeon must approve all vitamin and mineral supplements °  °Activity and Exercise: It is important to continue walking at home. Limit your physical activity as instructed by your doctor. During this time, use these guidelines: °• Do not lift anything greater than ten  (10) pounds for at least two (2) weeks °• Do not go back to work or drive until your surgeon says you can °• You may have sex when you feel comfortable °o It is VERY important for male  patients to use a reliable birth control method; fertility often increase after surgery °o Do not get pregnant for at least 18 months °• Start exercising as soon as your doctor tells you that you can °o Make sure your doctor approves any physical activity °• Start with a simple walking program °• Walk 5-15 minutes each day, 7 days per week °• Slowly increase until you are walking 30-45 minutes per day °• Consider joining our BELT program. [phone] or email [email or web address] °  °Special Instructions Things to remember: °• Free counseling is available for you and your family through collaboration between [facility] and [employer]. Please call [phone] and leave a message °• Use your CPAP when sleeping if this applies to you °• Consider buying a medical alert bracelet that says you had lap-band surgery °  °  You will likely have your first fill (fluid added to your band) 6 - 8 weeks after surgery °• [facility] has a free Bariatric Surgery Support Group that meets monthly, the 3rd Thursday, 6pm. [facility] Classrooms. You can see classes online at [email or web address] °• It is very important to keep all follow up appointments with your surgeon, nutritionist, primary care physician, and behavioral health practitioner °o After the first year, please follow up with your bariatric surgeon and nutritionist at least once a year in order to maintain best weight loss results °      °             [facility]:  [phone] ° °             [facility]: [phone] ° °             Bariatric Nurse Coordinator: [phone]  °Gastric Bypass/Sleeve Home Care Instructions  Rev. 11/2012    ° °                                                    Reviewed and Endorsed °                                                     by [facility] Patient Education Committee, Jan, 2014 ° ° ° ° ° ° ° ° °Aprepitant Discharge Instructions  °On the day of surgery you  were given the medication aprepitant. This medication interacts with hormonal forms of birth control (oral contraceptives and injected or implanted birth control) and may make them ineffective. °IF YOU USE ANY HORMONAL FORM OF BIRTH CONTROL, YOU MUST USE AN ADDITIONAL BARRIER BIRTH CONTROL METHOD FOR ONE MONTH after receiving aprepitant or there is a chance you could become pregnant. ° °

## 2016-09-17 NOTE — Anesthesia Preprocedure Evaluation (Addendum)
Anesthesia Evaluation  Patient identified by MRN, date of birth, ID band Patient awake    Reviewed: Allergy & Precautions, NPO status , Patient's Chart, lab work & pertinent test results  Airway Mallampati: III  TM Distance: >3 FB   Mouth opening: Pediatric Airway  Dental  (+) Teeth Intact   Pulmonary sleep apnea ,    breath sounds clear to auscultation       Cardiovascular hypertension, (-) Cardiac Defibrillator  Rhythm:Regular Rate:Normal     Neuro/Psych TIA Neuromuscular disease    GI/Hepatic Neg liver ROS,   Endo/Other    Renal/GU negative Renal ROS     Musculoskeletal   Abdominal   Peds  Hematology negative hematology ROS (+)   Anesthesia Other Findings   Reproductive/Obstetrics                            Anesthesia Physical Anesthesia Plan  ASA: III  Anesthesia Plan: General   Post-op Pain Management:    Induction: Intravenous  Airway Management Planned: Oral ETT and Video Laryngoscope Planned  Additional Equipment:   Intra-op Plan:   Post-operative Plan: Extubation in OR  Informed Consent: I have reviewed the patients History and Physical, chart, labs and discussed the procedure including the risks, benefits and alternatives for the proposed anesthesia with the patient or authorized representative who has indicated his/her understanding and acceptance.   Dental advisory given  Plan Discussed with:   Anesthesia Plan Comments:         Anesthesia Quick Evaluation

## 2016-09-17 NOTE — Anesthesia Postprocedure Evaluation (Signed)
Anesthesia Post Note  Patient: Larry Dean  Procedure(s) Performed: Procedure(s) (LRB): LAPAROSCOPIC GASTRIC SLEEVE RESECTION WITH UPPPER ENDO (N/A) UPPER GI ENDOSCOPY  Patient location during evaluation: Endoscopy Anesthesia Type: MAC Level of consciousness: awake and alert Pain management: pain level controlled Vital Signs Assessment: post-procedure vital signs reviewed and stable Respiratory status: spontaneous breathing, nonlabored ventilation, respiratory function stable and patient connected to nasal cannula oxygen Cardiovascular status: stable and blood pressure returned to baseline Anesthetic complications: no    Last Vitals:  Vitals:   09/17/16 1140 09/17/16 1245  BP: 114/60 108/61  Pulse: 77 95  Resp: 20 (!) 22  Temp: 36.6 C 36.6 C    Last Pain:  Vitals:   09/17/16 1245  TempSrc: Axillary  PainSc:                  Aharon Carriere,JAMES TERRILL

## 2016-09-17 NOTE — Op Note (Signed)
Preop Diagnosis: Obesity Class III  Postop Diagnosis: same  Procedure performed: laparoscopic Sleeve Gastrectomy  Assitant: Ovidio Kinavid Newman  Indications:  The patient is a [age] y.o. year-old morbidly obese male who has been followed in the Bariatric Clinic as an outpatient. This patient was diagnosed with morbid obesity with a BMI of Body mass index is 56.53 kg/m. and significant co-morbidities including hypertension.  The patient was counseled extensively in the Bariatric Outpatient Clinic and after a thorough explanation of the risks and benefits of surgery (including death from complications, bowel leak, infection such as peritonitis and/or sepsis, internal hernia, bleeding, need for blood transfusion, bowel obstruction, organ failure, pulmonary embolus, deep venous thrombosis, wound infection, incisional hernia, skin breakdown, and others entailed on the consent form) and after a compliant diet and exercise program, the patient was scheduled for an elective laparoscopic sleeve gastrectomy.  Description of Operation:  Following informed consent, the patient was taken to the operating room and placed on the operating table in the supine position.  He had previously received prophylactic antibiotics and subcutaneous heparin for DVT prophylaxis in the pre-op holding area.  After induction of general endotracheal anesthesia by the anesthesiologist, the patient underwent placement of sequential compression devices, Foley catheter and an oro-gastric tube.  A timeout was confirmed by the surgery and anesthesia teams.  The patient was adequately padded at all pressure points and placed on a footboard to prevent slippage from the OR table during extremes of position during surgery.  He underwent a routine sterile prep and drape of her entire abdomen.    Next, A transverse incision was made under the left subcostal area and a 5mm optical viewing trocar was introduced into the peritoneal cavity. Pneumoperitoneum  was applied with a high flow and low pressure. A laparoscope was inserted to confirm placement. A extraperitoneal block was then placed at the lateral abdominal wall using exparel . 5 additional trocars were placed: 1 5mm trocar to the left of the midline. 1 additional 5mm trocar in the left lateral area, 1 12mm trocar in the right mid abdomen, and 1 5mm trocar in the right subcostal area.  There was a moderate sized umbilical hernia with omentum into the sac. This was left alone as all ports were placed above its location.  The fat pad at the GE junction was incised and the gastrodiaphragmatic ligament was divided using the Harmonic scalpel. Next, a hole was created through the lesser omentum along the greater curve of the stomach to enter the lesser sac. The vessels along the greater omentum were  Then ligated and divided using the Harmonic scalpel moving towards the spleen and then short gastric vessels were ligated and divided in the same fashion to fully mobilize the fundus. The left crus was identified to ensure completion of the dissection. Next the antrum was measured and dissection continued inferiorly along the greater curve towards the pylorus and stopped 6cm from the pylorus.   A 40Fr ViSiGi dilator was placed into the esophgaus and along the lesser curve of the stomach and placed on suction. A 60mm 4-475mm tristapler was used to begin the resection along the antrum being sure to stay well away from the angularis by angling the jaws of the stapler towards the greater curve. An additional 60mm 4-625mm tristapler was used to continue the dissection. Then multiple 60mm 3-544mm tristapler loads were used to complete the resection staying along the ViSiGi and ensuring the fundus was not retained by appropriately retracting it lateral. Air was inserted  through the ViSiGi to perform a leak test showing no bubbles and a neutral lie of the stomach.  The assistant then went and performed an upper endoscopy and  leak test. No bubbles were seen and the sleeve and antrum distended appropriately. The specimen was then placed in an endocatch bag and removed by the 15mm port. The bag ripped and the stomach was removed without the bag, the bag was later removed in entirety. The fascia of the 15mm port was closed with a 0 vicryl by suture passer. Hemostasis was ensured. Pneumoperitoneum was evacuated, all ports were removed and all incisions closed with 4-0 monocryl suture in subcuticular fashion. Glue was put in place for dressing. The patient awoke from anesthesia and was brought to pacu in stable condition. All counts were correct.  Specimens:  Sleeve gastrectomy  Post-Op Plan:       Pain Management: PO, prn      Antibiotics: Prophylactic      Anticoagulation: Prophylactic, Starting now      Post Op Studies/Consults: Not applicable      Intended Discharge: within 48h      Intended Outpatient Follow-Up: Two Week      Intended Outpatient Studies: Not Applicable      Other: Not Applicable   De BlanchLuke Aaron Avrielle Fry

## 2016-09-17 NOTE — Anesthesia Preprocedure Evaluation (Signed)
Anesthesia Evaluation  Patient identified by MRN, date of birth, ID band Patient awake    Reviewed: Allergy & Precautions, NPO status , Patient's Chart, lab work & pertinent test results  Airway        Dental   Pulmonary sleep apnea ,           Cardiovascular hypertension,      Neuro/Psych TIA Neuromuscular disease    GI/Hepatic Neg liver ROS, GERD  ,  Endo/Other  negative endocrine ROSMorbid obesity  Renal/GU negative Renal ROS     Musculoskeletal   Abdominal   Peds  Hematology   Anesthesia Other Findings   Reproductive/Obstetrics                             Anesthesia Physical Anesthesia Plan Anesthesia Quick Evaluation

## 2016-09-18 ENCOUNTER — Encounter (HOSPITAL_COMMUNITY): Payer: Self-pay | Admitting: General Surgery

## 2016-09-18 LAB — COMPREHENSIVE METABOLIC PANEL
ALT: 45 U/L (ref 17–63)
ANION GAP: 8 (ref 5–15)
AST: 34 U/L (ref 15–41)
Albumin: 4 g/dL (ref 3.5–5.0)
Alkaline Phosphatase: 68 U/L (ref 38–126)
BUN: 13 mg/dL (ref 6–20)
CHLORIDE: 104 mmol/L (ref 101–111)
CO2: 26 mmol/L (ref 22–32)
CREATININE: 1.01 mg/dL (ref 0.61–1.24)
Calcium: 9 mg/dL (ref 8.9–10.3)
Glucose, Bld: 112 mg/dL — ABNORMAL HIGH (ref 65–99)
POTASSIUM: 4.2 mmol/L (ref 3.5–5.1)
SODIUM: 138 mmol/L (ref 135–145)
Total Bilirubin: 0.8 mg/dL (ref 0.3–1.2)
Total Protein: 7.4 g/dL (ref 6.5–8.1)

## 2016-09-18 LAB — CBC WITH DIFFERENTIAL/PLATELET
Basophils Absolute: 0 10*3/uL (ref 0.0–0.1)
Basophils Relative: 0 %
EOS ABS: 0 10*3/uL (ref 0.0–0.7)
EOS PCT: 0 %
HCT: 40 % (ref 39.0–52.0)
Hemoglobin: 13.2 g/dL (ref 13.0–17.0)
LYMPHS ABS: 1.7 10*3/uL (ref 0.7–4.0)
LYMPHS PCT: 18 %
MCH: 27 pg (ref 26.0–34.0)
MCHC: 33 g/dL (ref 30.0–36.0)
MCV: 81.8 fL (ref 78.0–100.0)
MONO ABS: 1 10*3/uL (ref 0.1–1.0)
Monocytes Relative: 11 %
Neutro Abs: 7 10*3/uL (ref 1.7–7.7)
Neutrophils Relative %: 71 %
PLATELETS: 267 10*3/uL (ref 150–400)
RBC: 4.89 MIL/uL (ref 4.22–5.81)
RDW: 13.7 % (ref 11.5–15.5)
WBC: 9.7 10*3/uL (ref 4.0–10.5)

## 2016-09-18 MED ORDER — IRBESARTAN 150 MG PO TABS
150.0000 mg | ORAL_TABLET | Freq: Every day | ORAL | Status: DC
  Administered 2016-09-18 – 2016-09-19 (×2): 150 mg via ORAL
  Filled 2016-09-18 (×2): qty 1

## 2016-09-18 NOTE — Progress Notes (Signed)
  Progress Note: Metabolic and Bariatric Surgery Service   Subjective: Pain in left side and gas pain overnight, no nausea, some dizziness yesterday associated with low blood pressure  Objective: Vital signs in last 24 hours: Temp:  [97 F (36.1 C)-98 F (36.7 C)] 98 F (36.7 C) (11/21 0541) Pulse Rate:  [62-100] 62 (11/21 0541) Resp:  [10-23] 20 (11/21 0541) BP: (108-158)/(60-98) 158/98 (11/21 0541) SpO2:  [94 %-100 %] 99 % (11/21 0541) Last BM Date: 09/16/16  Intake/Output from previous day: 11/20 0701 - 11/21 0700 In: 2885 [P.O.:110; I.V.:2675; IV Piggyback:100] Out: 1910 [Urine:1900; Blood:10] Intake/Output this shift: No intake/output data recorded.  Lungs: CTAB  Cardiovascular: RRR  Abd: soft, ATTP, ND, incisions c/d/i no erythema  Extremities: no edema  Neuro: AOx4  Lab Results: CBC   Recent Labs  09/17/16 1015 09/18/16 0444  WBC 11.5* 9.7  HGB 13.8  13.7 13.2  HCT 40.8  41.9 40.0  PLT 261 267   BMET  Recent Labs  09/17/16 1015 09/18/16 0444  NA  --  138  K  --  4.2  CL  --  104  CO2  --  26  GLUCOSE  --  112*  BUN  --  13  CREATININE 1.38* 1.01  CALCIUM  --  9.0   PT/INR No results for input(s): LABPROT, INR in the last 72 hours. ABG No results for input(s): PHART, HCO3 in the last 72 hours.  Invalid input(s): PCO2, PO2  Studies/Results:  Anti-infectives: Anti-infectives    Start     Dose/Rate Route Frequency Ordered Stop   09/17/16 0532  cefoTEtan in Dextrose 5% (CEFOTAN) IVPB 2 g     2 g Intravenous On call to O.R. 09/17/16 0532 09/17/16 0735      Medications: Scheduled Meds: . acetaminophen  1,000 mg Intravenous Q6H  . enoxaparin (LOVENOX) injection  30 mg Subcutaneous Q12H  . [START ON 09/19/2016] protein supplement shake  2 oz Oral Q2H   Continuous Infusions: . sodium chloride 125 mL/hr at 09/18/16 0023   PRN Meds:.oxyCODONE **AND** acetaminophen, acetaminophen (TYLENOL) oral liquid 160 mg/5 mL, morphine injection,  ondansetron (ZOFRAN) IV  Assessment/Plan: Patient Active Problem List   Diagnosis Date Noted  . Obesity 09/17/2016  . Bell's palsy 10/27/2012  . TIA (transient ischemic attack) 10/26/2012  . 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 Headache(784.0) 10/26/2012  . Hypertensive urgency 10/26/2012   s/p Procedure(s): LAPAROSCOPIC GASTRIC SLEEVE RESECTION WITH UPPPER ENDO UPPER GI ENDOSCOPY 09/17/2016 -POD 1 diet -continue ambulate -if improves by afternoon may be candidate for discharge  Disposition:  LOS: 1 day  The patient will be in the hospital for normal postop protocol  Rodman PickleLuke Aaron Lavan Imes, MD [phone] [facility]

## 2016-09-18 NOTE — Plan of Care (Signed)
Problem: Food- and Nutrition-Related Knowledge Deficit (NB-1.1) Goal: Nutrition education Formal process to instruct or train a patient/client in a skill or to impart knowledge to help patients/clients voluntarily manage or modify food choices and eating behavior to maintain or improve health. Outcome: Completed/Met Date Met: 09/18/16 Nutrition Education Note  Received consult for diet education per DROP protocol.   Discussed 2 week post op diet with pt. Emphasized that liquids must be non carbonated, non caffeinated, and sugar free. Fluid goals discussed. Reviewed progression of diet to include soft proteins at 7-10 days post-op. Pt to follow up with outpatient bariatric RD for further diet progression after 2 weeks. Multivitamins and minerals also reviewed. Teach back method used, pt expressed understanding, expect good compliance.   Diet: First 2 Weeks  You will see the dietitian about two (2) weeks after your surgery. The dietitian will increase the types of foods you can eat if you are handling liquids well:  If you have severe vomiting or nausea and cannot handle clear liquids lasting longer than 1 day, call your surgeon  Protein Shake  Drink at least 2 ounces of shake 5-6 times per day  Each serving of protein shakes (usually 8 - 12 ounces) should have a minimum of:  15 grams of protein  And no more than 5 grams of carbohydrate  Goal for protein each day:  Men = 80 grams per day  Women = 60 grams per day  Protein powder may be added to fluids such as non-fat milk or Lactaid milk or Soy milk (limit to 35 grams added protein powder per serving)   Hydration  Slowly increase the amount of water and other clear liquids as tolerated (See Acceptable Fluids)  Slowly increase the amount of protein shake as tolerated  Sip fluids slowly and throughout the day  May use sugar substitutes in small amounts (no more than 6 - 8 packets per day; i.e. Splenda)   Fluid Goal  The first goal is to  drink at least 8 ounces of protein shake/drink per day (or as directed by the nutritionist); some examples of protein shakes are [employer], [employer], EAS Edge HP, and Unjury. See handout from pre-op Bariatric Education Class:  Slowly increase the amount of protein shake you drink as tolerated  You may find it easier to slowly sip shakes throughout the day  It is important to get your proteins in first  Your fluid goal is to drink 64 - 100 ounces of fluid daily  It may take a few weeks to build up to this  32 oz (or more) should be clear liquids  And  32 oz (or more) should be full liquids (see below for examples)  Liquids should not contain sugar, caffeine, or carbonation   Clear Liquids:  Water or Sugar-free flavored water (i.e. Fruit H2O, Propel)  Decaffeinated coffee or tea (sugar-free)  Crystal Lite, Wyler's Lite, Minute Maid Lite  Sugar-free Jell-O  Bouillon or broth  Sugar-free Popsicle: *Less than 20 calories each; Limit 1 per day   Full Liquids:  Protein Shakes/Drinks + 2 choices per day of other full liquids  Full liquids must be:  No More Than 12 grams of Carbs per serving  No More Than 3 grams of Fat per serving  Strained low-fat cream soup  Non-Fat milk  Fat-free Lactaid Milk  Sugar-free yogurt (Dannon Lite & Fit, Greek yogurt)     Clayton Bibles, MS, RD, LDN Pager: [phone] After Hours Pager: [phone]

## 2016-09-19 LAB — CBC WITH DIFFERENTIAL/PLATELET
Basophils Absolute: 0 10*3/uL (ref 0.0–0.1)
Basophils Relative: 0 %
EOS ABS: 0 10*3/uL (ref 0.0–0.7)
Eosinophils Relative: 0 %
HEMATOCRIT: 42.5 % (ref 39.0–52.0)
HEMOGLOBIN: 13.8 g/dL (ref 13.0–17.0)
LYMPHS ABS: 2 10*3/uL (ref 0.7–4.0)
LYMPHS PCT: 22 %
MCH: 26.5 pg (ref 26.0–34.0)
MCHC: 32.5 g/dL (ref 30.0–36.0)
MCV: 81.6 fL (ref 78.0–100.0)
Monocytes Absolute: 0.7 10*3/uL (ref 0.1–1.0)
Monocytes Relative: 8 %
NEUTROS ABS: 6.1 10*3/uL (ref 1.7–7.7)
NEUTROS PCT: 70 %
Platelets: 293 10*3/uL (ref 150–400)
RBC: 5.21 MIL/uL (ref 4.22–5.81)
RDW: 13.7 % (ref 11.5–15.5)
WBC: 8.8 10*3/uL (ref 4.0–10.5)

## 2016-09-19 MED ORDER — DOCUSATE SODIUM 100 MG PO CAPS
100.0000 mg | ORAL_CAPSULE | Freq: Two times a day (BID) | ORAL | Status: DC
  Administered 2016-09-19: 100 mg via ORAL
  Filled 2016-09-19: qty 1

## 2016-09-19 MED ORDER — DOCUSATE SODIUM 100 MG PO CAPS: 100.0000 mg | ORAL_CAPSULE | Freq: Two times a day (BID) | ORAL | 0 refills | Status: DC

## 2016-09-19 MED ORDER — OXYCODONE HCL 5 MG/5ML PO SOLN: 5.0000 mg | ORAL | 0 refills | Status: DC | PRN

## 2016-09-19 MED ORDER — OXYCODONE HCL 5 MG/5ML PO SOLN: 5.0000 mg | ORAL | Status: DC | PRN

## 2016-09-19 MED ORDER — SIMETHICONE 80 MG PO CHEW
80.0000 mg | CHEWABLE_TABLET | Freq: Four times a day (QID) | ORAL | Status: DC | PRN
  Administered 2016-09-19: 80 mg via ORAL
  Filled 2016-09-19: qty 1

## 2016-09-19 MED FILL — oxyCODONE HCL 5 MG/5ML SOLN: 5 | 16 days supply | Qty: 100 | Fill #0

## 2016-09-19 NOTE — Progress Notes (Signed)
Patient alert and oriented, pain is controlled. Patient is tolerating fluids, advanced to protein shake today, patient is tolerating well.  Reviewed Gastric sleeve discharge instructions with patient and patient is able to articulate understanding.  Provided information on BELT program, Support Group and [facility] outpatient pharmacy. All questions answered, will continue to monitor.  

## 2016-09-19 NOTE — Discharge Summary (Signed)
Physician Discharge Summary  Patient ID: Larry Dean MRN: [id number] DOB/AGE: 33-May-1982 33 y.o.  Admit date: 09/17/2016 Discharge date: 09/19/2016  Admission Diagnoses:  Discharge Diagnoses:  Active Problems:   Obesity   Discharged Condition: good  Hospital Course: The patient was admitted after lap sleeve gastrectomy. He was started on water and in POD 1 first shift took 400ml. He struggled with pain and some nausea and stayed until POD 2. It resolved and he was discharged home POD 2.  Consults: None  Significant Diagnostic Studies:  CBC    Component Value Date/Time   WBC 8.8 09/19/2016 0432   RBC 5.21 09/19/2016 0432   HGB 13.8 09/19/2016 0432   HCT 42.5 09/19/2016 0432   PLT 293 09/19/2016 0432   MCV 81.6 09/19/2016 0432   MCH 26.5 09/19/2016 0432   MCHC 32.5 09/19/2016 0432   RDW 13.7 09/19/2016 0432   LYMPHSABS 2.0 09/19/2016 0432   MONOABS 0.7 09/19/2016 0432   EOSABS 0.0 09/19/2016 0432   BASOSABS 0.0 09/19/2016 0432     Treatments: surgery: lap sleeve gastrectomy  Discharge Exam: Blood pressure (!) 137/97, pulse 61, temperature 99 F (37.2 C), temperature source Oral, resp. rate (!) 21, height 5\' 10"  (1.778 m), weight (!) 178.7 kg (394 lb), SpO2 97 %. General appearance: alert and cooperative Head: Normocephalic, without obvious abnormality, atraumatic Back: symmetric, no curvature. ROM normal. No CVA tenderness. Resp: clear to auscultation bilaterally Cardio: regular rate and rhythm, S1, S2 normal, no murmur, click, rub or gallop GI: incisions c/d/i, no erythema or drainage  Disposition: 01-Home or Self Care  Discharge Instructions    Call MD for:  difficulty breathing, headache or visual disturbances    Complete by:  As directed    Call MD for:  persistant nausea and vomiting    Complete by:  As directed    Call MD for:  redness, tenderness, or signs of infection (pain, swelling, redness, odor or green/yellow discharge around incision site)     Complete by:  As directed    Call MD for:  severe uncontrolled pain    Complete by:  As directed    Call MD for:  temperature >100.4    Complete by:  As directed    Discharge wound care:    Complete by:  As directed    Ok to shower tomorrow  Glue will likely peel off in 1-3 weeks   Increase activity slowly    Complete by:  As directed    Lifting restrictions    Complete by:  As directed    Do not lift more than 20 pounds for 3-4 weeks       Medication List    TAKE these medications   BC HEADACHE POWDER PO Take 2 packets by mouth daily as needed (headaches). Notes to patient:  Avoid NSAIDs for 6-8 weeks after surgery   docusate sodium 100 MG capsule Commonly known as:  COLACE Take 1 capsule (100 mg total) by mouth 2 (two) times daily.   irbesartan-hydrochlorothiazide 150-12.5 MG tablet Commonly known as:  AVALIDE Take 1 tablet by mouth daily. Notes to patient:  Monitor Blood Pressure Daily and keep a log for primary care physician.  Monitor for symptoms of dehydration.  You may need to make changes to your medications with rapid weight loss.     valsartan 160 MG tablet Commonly known as:  DIOVAN Take 160 mg by mouth daily. Notes to patient:  Monitor Blood Pressure Daily and keep a log  for primary care physician.  You may need to make changes to your medications with rapid weight loss.        Follow-up Information    Rodman PickleLuke Aaron Jeyren Danowski, MD. Go on 10/10/2016.   Specialty:  General Surgery Why:  at 10:15 AM for post-op check Contact information: [address] [place] [address] [phone]        Rodman PickleLuke Aaron Delories Mauri, MD Follow up.   Specialty:  General Surgery Contact information: [address] [place] [address] [phone]           Signed: De BlanchLuke Aaron Hadessah Grennan 09/19/2016, 7:56 AM

## 2016-09-19 NOTE — Progress Notes (Signed)
Pt's vitals are WNL, tolerating water and shakes and pain is under control. Discussed discharge instructions with both patient and wife. Discharged to home with prescription.

## 2016-10-02 ENCOUNTER — Ambulatory Visit: Payer: Self-pay | Admitting: Dietician

## 2018-05-16 ENCOUNTER — Ambulatory Visit: Payer: Self-pay | Admitting: General Surgery

## 2019-06-17 ENCOUNTER — Inpatient Hospital Stay (HOSPITAL_COMMUNITY)
Admission: RE | Admit: 2019-06-17 | Discharge: 2019-06-21 | DRG: 885 | Disposition: A | Discharge status: Home / Self Care | Payer: 59 | Attending: Psychiatry | Admitting: Psychiatry

## 2019-06-17 DIAGNOSIS — I10 Essential (primary) hypertension: Secondary | ICD-10-CM | POA: Diagnosis present

## 2019-06-17 DIAGNOSIS — K219 Gastro-esophageal reflux disease without esophagitis: Secondary | ICD-10-CM | POA: Diagnosis present

## 2019-06-17 DIAGNOSIS — G4700 Insomnia, unspecified: Secondary | ICD-10-CM | POA: Diagnosis present

## 2019-06-17 DIAGNOSIS — F4000 Agoraphobia, unspecified: Secondary | ICD-10-CM | POA: Diagnosis present

## 2019-06-17 DIAGNOSIS — F410 Panic disorder [episodic paroxysmal anxiety] without agoraphobia: Secondary | ICD-10-CM | POA: Diagnosis present

## 2019-06-17 DIAGNOSIS — R45851 Suicidal ideations: Secondary | ICD-10-CM | POA: Diagnosis present

## 2019-06-17 DIAGNOSIS — R45850 Homicidal ideations: Secondary | ICD-10-CM | POA: Diagnosis present

## 2019-06-17 DIAGNOSIS — Z62810 Personal history of physical and sexual abuse in childhood: Secondary | ICD-10-CM | POA: Diagnosis present

## 2019-06-17 DIAGNOSIS — F332 Major depressive disorder, recurrent severe without psychotic features: Secondary | ICD-10-CM | POA: Diagnosis not present

## 2019-06-17 DIAGNOSIS — Z20828 Contact with and (suspected) exposure to other viral communicable diseases: Secondary | ICD-10-CM | POA: Diagnosis present

## 2019-06-17 DIAGNOSIS — F4310 Post-traumatic stress disorder, unspecified: Secondary | ICD-10-CM

## 2019-06-17 MED ORDER — MAGNESIUM HYDROXIDE 400 MG/5ML PO SUSP: 30.0000 mL | Freq: Every day | ORAL | Status: DC | PRN

## 2019-06-17 MED ORDER — TRAZODONE HCL 50 MG PO TABS: 50.0000 mg | ORAL_TABLET | Freq: Every evening | ORAL | Status: DC | PRN

## 2019-06-17 MED ORDER — HYDROXYZINE HCL 50 MG PO TABS: 50.0000 mg | ORAL_TABLET | Freq: Three times a day (TID) | ORAL | Status: DC | PRN

## 2019-06-17 MED ORDER — ACETAMINOPHEN 325 MG PO TABS
650.0000 mg | ORAL_TABLET | Freq: Four times a day (QID) | ORAL | Status: DC | PRN
  Administered 2019-06-20 (×2): 650 mg via ORAL
  Filled 2019-06-17 (×2): qty 2

## 2019-06-17 MED ORDER — ALUM & MAG HYDROXIDE-SIMETH 200-200-20 MG/5ML PO SUSP: 30.0000 mL | ORAL | Status: DC | PRN
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
## 2019-06-17 NOTE — H&P (Signed)
Behavioral Health Medical Screening Exam  Larry Dean is an 38 y.o. male that presents to [facility] unaccompanied.  Upon approach patient is sitting in chair with his head in his hands.  His appearance is appropriate to the situation. His mood is depressed and his affect is congruent to his mood.  When asked what brought, pt stated that he just couldn't take it anymore. Pt stated that with all the pressure that his sons mother is putting him through, he wants to kill her. Pt stated that he feels like he's "losing it". Patient was visiting with his therapist today and his therapist advised that he comes into the hospital.  Total Time spent with patient: 20 minutes  Psychiatric Specialty Exam: Physical Exam  Nursing note and vitals reviewed. Constitutional: He is oriented to person, place, and time. He appears well-developed.  HENT:  Head: Normocephalic.  Eyes: Pupils are equal, round, and reactive to light.  Neck: Normal range of motion.  Cardiovascular: Normal rate.  Respiratory: Effort normal.  Musculoskeletal: Normal range of motion.  Neurological: He is alert and oriented to person, place, and time.  Skin: Skin is warm and dry.  Psychiatric: His speech is normal. His affect is angry. He is agitated. Cognition and memory are normal. He expresses impulsivity. He exhibits a depressed mood. He expresses homicidal ideation.    Review of Systems  Psychiatric/Behavioral: Positive for depression. Negative for substance abuse and suicidal ideas.  All other systems reviewed and are negative.   Blood pressure 136/84, pulse (!) 58, temperature 98.3 F (36.8 C), temperature source Oral, resp. rate 18, SpO2 100 %.There is no height or weight on file to calculate BMI.  General Appearance: Casual  Eye Contact:  Fair  Speech:  Normal Rate  Volume:  Normal  Mood:  Anxious, Depressed and Irritable  Affect:  Congruent and Depressed  Thought Process:  Coherent and Descriptions of Associations: Intact   Orientation:  Full (Time, Place, and Person)  Thought Content:  Negative and Illogical  Suicidal Thoughts:  No  Homicidal Thoughts:  Yes.  with intent/plan  Memory:  Immediate;   Good  Judgement:  Fair  Insight:  Fair  Psychomotor Activity:  Normal  Concentration: Concentration: Good  Recall:  Good  Fund of Knowledge:Good  Language: Good  Akathisia:  NA  Handed:  Right  AIMS (if indicated):     Assets:  Communication Skills Desire for Improvement  Sleep:       Musculoskeletal: Strength & Muscle Tone: within normal limits Gait & Station: normal Patient leans: N/A  Blood pressure 136/84, pulse (!) 58, temperature 98.3 F (36.8 C), temperature source Oral, resp. rate 18, SpO2 100 %.  Recommendations:  Based on my evaluation the patient does not appear to have an emergency medical condition.  Based on my evaluation the patient meets psychiatric inpatient accommodations for therapeutic processing and medication management  Deloria Lair, NP 06/17/2019, 8:54 PM
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
## 2019-06-18 ENCOUNTER — Other Ambulatory Visit: Payer: Self-pay

## 2019-06-18 ENCOUNTER — Encounter (HOSPITAL_COMMUNITY): Payer: Self-pay

## 2019-06-18 DIAGNOSIS — R45850 Homicidal ideations: Secondary | ICD-10-CM

## 2019-06-18 DIAGNOSIS — F332 Major depressive disorder, recurrent severe without psychotic features: Principal | ICD-10-CM

## 2019-06-18 DIAGNOSIS — F4310 Post-traumatic stress disorder, unspecified: Secondary | ICD-10-CM

## 2019-06-18 LAB — COMPREHENSIVE METABOLIC PANEL
ALT: 22 U/L (ref 0–44)
AST: 22 U/L (ref 15–41)
Albumin: 4.3 g/dL (ref 3.5–5.0)
Alkaline Phosphatase: 59 U/L (ref 38–126)
Anion gap: 6 (ref 5–15)
BUN: 11 mg/dL (ref 6–20)
CO2: 27 mmol/L (ref 22–32)
Calcium: 9.2 mg/dL (ref 8.9–10.3)
Chloride: 105 mmol/L (ref 98–111)
Creatinine, Ser: 0.89 mg/dL (ref 0.61–1.24)
GFR calc Af Amer: >60 mL/min (ref >60)
GFR calc non Af Amer: >60 mL/min (ref >60)
Glucose, Bld: 77 mg/dL (ref 70–99)
Potassium: 3.6 mmol/L (ref 3.5–5.1)
Sodium: 138 mmol/L (ref 135–145)
Total Bilirubin: 1.1 mg/dL (ref 0.3–1.2)
Total Protein: 7.2 g/dL (ref 6.5–8.1)

## 2019-06-18 LAB — ETHANOL: Alcohol, Ethyl (B): <10 mg/dL (ref <10)

## 2019-06-18 LAB — HEMOGLOBIN A1C
Hgb A1c MFr Bld: 5.1 % (ref 4.8–5.6)
Mean Plasma Glucose: 99.67 mg/dL

## 2019-06-18 LAB — LIPID PANEL
Cholesterol: 165 mg/dL (ref 0–200)
HDL: 69 mg/dL (ref >40)
LDL Cholesterol: 89 mg/dL (ref 0–99)
Total CHOL/HDL Ratio: 2.4 RATIO
Triglycerides: 37 mg/dL (ref <150)
VLDL: 7 mg/dL (ref 0–40)

## 2019-06-18 LAB — HEPATIC FUNCTION PANEL
ALT: 22 U/L (ref 0–44)
AST: 21 U/L (ref 15–41)
Albumin: 4.4 g/dL (ref 3.5–5.0)
Alkaline Phosphatase: 60 U/L (ref 38–126)
Bilirubin, Direct: 0.2 mg/dL (ref 0.0–0.2)
Indirect Bilirubin: 1 mg/dL — ABNORMAL HIGH (ref 0.3–0.9)
Total Bilirubin: 1.2 mg/dL (ref 0.3–1.2)
Total Protein: 7.3 g/dL (ref 6.5–8.1)

## 2019-06-18 LAB — URINALYSIS, COMPLETE (UACMP) WITH MICROSCOPIC
Bacteria, UA: NONE SEEN
Bilirubin Urine: NEGATIVE
Glucose, UA: NEGATIVE mg/dL
Hgb urine dipstick: NEGATIVE
Ketones, ur: 5 mg/dL — AB
Leukocytes,Ua: NEGATIVE
Nitrite: NEGATIVE
Protein, ur: NEGATIVE mg/dL
Specific Gravity, Urine: 1.021 (ref 1.005–1.030)
pH: 6 (ref 5.0–8.0)

## 2019-06-18 LAB — CBC
HCT: 44.3 % (ref 39.0–52.0)
Hemoglobin: 14.4 g/dL (ref 13.0–17.0)
MCH: 29 pg (ref 26.0–34.0)
MCHC: 32.5 g/dL (ref 30.0–36.0)
MCV: 89.3 fL (ref 80.0–100.0)
Platelets: 202 10*3/uL (ref 150–400)
RBC: 4.96 MIL/uL (ref 4.22–5.81)
RDW: 12.5 % (ref 11.5–15.5)
WBC: 2.9 10*3/uL — ABNORMAL LOW (ref 4.0–10.5)
nRBC: 0 % (ref 0.0–0.2)

## 2019-06-18 LAB — SARS CORONAVIRUS 2 BY RT PCR (HOSPITAL ORDER, PERFORMED IN ~~LOC~~ HOSPITAL LAB): SARS Coronavirus 2: NEGATIVE

## 2019-06-18 MED ORDER — LORAZEPAM 1 MG PO TABS: 1.0000 mg | ORAL_TABLET | ORAL | Status: DC | PRN

## 2019-06-18 MED ORDER — TRAZODONE HCL 50 MG PO TABS
50.0000 mg | ORAL_TABLET | Freq: Every evening | ORAL | Status: DC | PRN
  Administered 2019-06-19: 01:00:00 50 mg via ORAL
  Filled 2019-06-18: qty 1

## 2019-06-18 MED ORDER — ARIPIPRAZOLE 2 MG PO TABS
2.0000 mg | ORAL_TABLET | Freq: Every day | ORAL | Status: DC
  Administered 2019-06-18: 12:00:00 2 mg via ORAL
  Filled 2019-06-18 (×3): qty 1

## 2019-06-18 MED ORDER — ENSURE ENLIVE PO LIQD
237.0000 mL | Freq: Two times a day (BID) | ORAL | Status: DC
  Administered 2019-06-19 – 2019-06-20 (×2): 237 mL via ORAL

## 2019-06-18 MED ORDER — FLUOXETINE HCL 20 MG PO CAPS
20.0000 mg | ORAL_CAPSULE | Freq: Every day | ORAL | Status: DC
  Administered 2019-06-18: 20 mg via ORAL
  Filled 2019-06-18 (×3): qty 1

## 2019-06-18 MED ORDER — ZIPRASIDONE MESYLATE 20 MG IM SOLR: 20.0000 mg | INTRAMUSCULAR | Status: DC | PRN

## 2019-06-18 MED ORDER — POTASSIUM CHLORIDE CRYS ER 10 MEQ PO TBCR
10.0000 meq | EXTENDED_RELEASE_TABLET | Freq: Two times a day (BID) | ORAL | Status: AC
  Administered 2019-06-18 – 2019-06-19 (×3): 10 meq via ORAL
  Filled 2019-06-18 (×4): qty 1

## 2019-06-18 MED ORDER — LORAZEPAM 0.5 MG PO TABS
0.5000 mg | ORAL_TABLET | Freq: Four times a day (QID) | ORAL | Status: DC | PRN
  Administered 2019-06-19 – 2019-06-20 (×2): 0.5 mg via ORAL
  Filled 2019-06-18 (×2): qty 1

## 2019-06-18 MED ORDER — OLANZAPINE 10 MG PO TBDP: 10.0000 mg | ORAL_TABLET | Freq: Three times a day (TID) | ORAL | Status: DC | PRN

## 2019-06-18 MED ORDER — PRAZOSIN HCL 1 MG PO CAPS
1.0000 mg | ORAL_CAPSULE | Freq: Every day | ORAL | Status: DC
  Administered 2019-06-18 – 2019-06-20 (×3): 1 mg via ORAL
  Filled 2019-06-18 (×6): qty 1

## 2019-06-18 MED ORDER — ADULT MULTIVITAMIN W/MINERALS CH
1.0000 | ORAL_TABLET | Freq: Every day | ORAL | Status: DC
  Administered 2019-06-18 – 2019-06-21 (×4): 1 via ORAL
  Filled 2019-06-18 (×6): qty 1
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
## 2019-06-18 NOTE — Progress Notes (Signed)
Type of Therapy and Topic:  Group Therapy:  Trust and Honesty 06/18/2019  Participation Level:  Did Not Attend   Description of Group:     In this group patients will be asked to explore the value of being honest.  Patients will be guided to discuss their thoughts, feelings, and behaviors related to honesty and trusting in others. Patients will process together how trust and honesty relate to forming relationships with peers, family members, and self. Each patient will be challenged to identify and express feelings of being vulnerable. Patients will discuss reasons why people are dishonest and identify alternative outcomes if one was truthful (to self or others). This group will be process-oriented, with patients participating in exploration of their own experiences, giving and receiving support, and processing challenge from other group members.    Therapeutic Goals:  1.  Patient will identify why honesty is important to relationships and how honesty overall affects relationships.  2.  Patient will identify a situation where they lied or were lied too and the  feelings, thought process, and behaviors surrounding the situation  3.  Patient will identify the meaning of being vulnerable, how that feels, and how that correlates to being honest with self and others.  4.  Patient will identify situations where they could have told the truth, but instead lied and explain reasons of dishonesty.     Summary of Patient Progress:   n/a  Therapeutic Modalities:   Cognitive Behavioral Therapy Solution Focused Therapy Motivational Interviewing Brief Therapy 

## 2019-06-18 NOTE — Progress Notes (Signed)
DAR NOTE: Patient presents with calm affect and pleasant mood.  Denies suicidal thoughts, pain, auditory and visual hallucinations.  Report poor appetite.  Food and fluid intake encouraged.  Rates depression at 5, hopelessness at 2, and anxiety at 0.  Maintained on routine safety checks.  Medications given as prescribed.  Support and encouragement offered as needed.  Attended group and participated.  States goal for today is "keeping positive attitude."  Patient observed socializing with peers in the dayroom.  Offered no complaint.
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
## 2019-06-18 NOTE — Progress Notes (Signed)
The patient shared in group that he had a good day since he has a good roommate and because he got along with his peers. His goal for tomorrow is to happier.

## 2019-06-19 DIAGNOSIS — F332 Major depressive disorder, recurrent severe without psychotic features: Secondary | ICD-10-CM | POA: Diagnosis not present

## 2019-06-19 DIAGNOSIS — F4310 Post-traumatic stress disorder, unspecified: Secondary | ICD-10-CM | POA: Diagnosis not present

## 2019-06-19 DIAGNOSIS — R45850 Homicidal ideations: Secondary | ICD-10-CM | POA: Diagnosis not present

## 2019-06-19 LAB — BASIC METABOLIC PANEL
Anion gap: 10 (ref 5–15)
BUN: 8 mg/dL (ref 6–20)
CO2: 25 mmol/L (ref 22–32)
Calcium: 9.6 mg/dL (ref 8.9–10.3)
Chloride: 104 mmol/L (ref 98–111)
Creatinine, Ser: 1.03 mg/dL (ref 0.61–1.24)
GFR calc Af Amer: >60 mL/min (ref >60)
GFR calc non Af Amer: >60 mL/min (ref >60)
Glucose, Bld: 89 mg/dL (ref 70–99)
Potassium: 3.7 mmol/L (ref 3.5–5.1)
Sodium: 139 mmol/L (ref 135–145)

## 2019-06-19 LAB — CBC WITH DIFFERENTIAL/PLATELET
Abs Immature Granulocytes: 0.01 10*3/uL (ref 0.00–0.07)
Basophils Absolute: 0 10*3/uL (ref 0.0–0.1)
Basophils Relative: 0 %
Eosinophils Absolute: 0 10*3/uL (ref 0.0–0.5)
Eosinophils Relative: 1 %
HCT: 47.2 % (ref 39.0–52.0)
Hemoglobin: 15.8 g/dL (ref 13.0–17.0)
Immature Granulocytes: 0 %
Lymphocytes Relative: 42 %
Lymphs Abs: 1.5 10*3/uL (ref 0.7–4.0)
MCH: 29.4 pg (ref 26.0–34.0)
MCHC: 33.5 g/dL (ref 30.0–36.0)
MCV: 87.9 fL (ref 80.0–100.0)
Monocytes Absolute: 0.3 10*3/uL (ref 0.1–1.0)
Monocytes Relative: 8 %
Neutro Abs: 1.7 10*3/uL (ref 1.7–7.7)
Neutrophils Relative %: 49 %
Platelets: 217 10*3/uL (ref 150–400)
RBC: 5.37 MIL/uL (ref 4.22–5.81)
RDW: 12 % (ref 11.5–15.5)
WBC: 3.6 10*3/uL — ABNORMAL LOW (ref 4.0–10.5)
nRBC: 0 % (ref 0.0–0.2)

## 2019-06-19 LAB — MAGNESIUM: Magnesium: 2.1 mg/dL (ref 1.7–2.4)

## 2019-06-19 LAB — PROLACTIN: Prolactin: 20.6 ng/mL — ABNORMAL HIGH (ref 4.0–15.2)

## 2019-06-19 LAB — TSH: TSH: 1.733 u[IU]/mL (ref 0.350–4.500)

## 2019-06-19 MED ORDER — TRAZODONE HCL 50 MG PO TABS
50.0000 mg | ORAL_TABLET | Freq: Every evening | ORAL | Status: DC | PRN
  Administered 2019-06-20: 50 mg via ORAL
  Filled 2019-06-19: qty 1

## 2019-06-19 MED ORDER — FLUOXETINE HCL 20 MG PO CAPS
20.0000 mg | ORAL_CAPSULE | Freq: Every day | ORAL | Status: DC
  Administered 2019-06-19 – 2019-06-21 (×3): 20 mg via ORAL
  Filled 2019-06-19 (×5): qty 1

## 2019-06-19 NOTE — BHH Group Notes (Addendum)
06/19/2019 8:45am Type of Group and Topic: Psychoeducational Group: Discharge Planning Participation Level: Active  Description of Group Discharge planning group reviews patient's anticipated discharge plans and assists patients to anticipate and address any barriers to wellness/recovery in the community. Suicide prevention education is reviewed with patients in group. Therapeutic Goals 1. Patients will state their anticipated discharge plan and mental health aftercare 2. Patients will identify potential barriers to wellness in the community setting 3. Patients will engage in problem solving, solution focused discussion of ways to anticipate and address barriers to wellness/recovery   Summary of Patient Progress Plan for Discharge/Comments:  Larry Dean reports he plans to return home with his fiance and will follow up with his current psychiatrist through [facility] in [place], [place]. Larry Dean states that he plans to also follow up with his current therapist to strengthen his coping mechanisms for dealing with his son's mother. He had no further questions or concerns.   Transportation Means:  [place] reports his fiance is picking him up at discharge.   Supports:  Fiance and friends.   Therapeutic Modalities: Motivational Interviewing     Radonna Ricker, MSW, [place] Worker [facility]  Phone: [phone] 06/19/2019 1:39 PM

## 2019-06-19 NOTE — Tx Team (Signed)
Interdisciplinary Treatment and Diagnostic Plan Update  06/19/2019 Time of Session: 9:00am [name] MRN: [id number]  Principal Diagnosis: <principal problem not specified>  Secondary Diagnoses: Active Problems:   PTSD (post-traumatic stress disorder)   Current Medications:  Current Facility-Administered Medications  Medication Dose Route Frequency Provider Last Rate Last Dose  . acetaminophen (TYLENOL) tablet 650 mg  650 mg Oral Q6H PRN Dixon, Rashaun M, NP      . alum & mag hydroxide-simeth (MAALOX/MYLANTA) 200-200-20 MG/5ML suspension 30 mL  30 mL Oral Q4H PRN Dixon, Rashaun M, NP      . feeding supplement (ENSURE ENLIVE) (ENSURE ENLIVE) liquid 237 mL  237 mL Oral BID BM Hampton Abbot, MD      . LORazepam (ATIVAN) tablet 0.5 mg  0.5 mg Oral Q6H PRN Cobos, Myer Peer, MD      . magnesium hydroxide (MILK OF MAGNESIA) suspension 30 mL  30 mL Oral Daily PRN Deloria Lair, NP      . multivitamin with minerals tablet 1 tablet  1 tablet Oral Daily Hampton Abbot, MD   1 tablet at 06/19/19 0805  . potassium chloride (K-DUR) CR tablet 10 mEq  10 mEq Oral BID Cobos, Myer Peer, MD   10 mEq at 06/19/19 0805  . prazosin (MINIPRESS) capsule 1 mg  1 mg Oral QHS Cobos, Myer Peer, MD   1 mg at 06/18/19 2211  . traZODone (DESYREL) tablet 50 mg  50 mg Oral QHS PRN,MR X 1 Dixon, Rashaun M, NP   50 mg at 06/19/19 0100   PTA Medications: Medications Prior to Admission  Medication Sig Dispense Refill Last Dose  . acetaminophen (TYLENOL) 325 MG tablet Take 650 mg by mouth every 6 (six) hours as needed for mild pain or headache.     Marland Kitchen FLUoxetine (PROZAC) 10 MG tablet Take 10 mg by mouth daily.     . Aspirin-Salicylamide-Caffeine (BC HEADACHE POWDER PO) Take 2 packets by mouth daily as needed (headaches).     . docusate sodium (COLACE) 100 MG capsule Take 1 capsule (100 mg total) by mouth 2 (two) times daily. (Patient not taking: Reported on 06/18/2019) 10 capsule 0 Not Taking at Unknown time  .  irbesartan-hydrochlorothiazide (AVALIDE) 150-12.5 MG tablet Take 1 tablet by mouth daily.  11 Not Taking at Unknown time  . oxyCODONE (ROXICODONE) 5 MG/5ML solution Take 5-10 mLs (5-10 mg total) by mouth every 4 (four) hours as needed for moderate pain. (Patient not taking: Reported on 06/18/2019) 100 mL 0 Completed Course at Unknown time  . valsartan (DIOVAN) 160 MG tablet Take 160 mg by mouth daily.   Not Taking at Unknown time    Patient Stressors: Financial difficulties Marital or family conflict Traumatic event  Patient Strengths: Ability for insight Active sense of humor Capable of independent living Facilities manager fund of knowledge Motivation for treatment/growth  Treatment Modalities: Medication Management, Group therapy, Case management,  1 to 1 session with clinician, Psychoeducation, Recreational therapy.   Physician Treatment Plan for Primary Diagnosis: <principal problem not specified> Long Term Goal(s): Improvement in symptoms so as ready for discharge Improvement in symptoms so as ready for discharge   Short Term Goals: Ability to identify changes in lifestyle to reduce recurrence of condition will improve Ability to verbalize feelings will improve Ability to disclose and discuss suicidal ideas Ability to demonstrate self-control will improve Ability to identify and develop effective coping behaviors will improve Ability to maintain clinical measurements within normal limits will improve Ability to  identify changes in lifestyle to reduce recurrence of condition will improve Ability to verbalize feelings will improve Ability to disclose and discuss suicidal ideas Ability to demonstrate self-control will improve Ability to identify and develop effective coping behaviors will improve Ability to maintain clinical measurements within normal limits will improve  Medication Management: Evaluate patient's response, side effects, and tolerance of medication  regimen.  Therapeutic Interventions: 1 to 1 sessions, Unit Group sessions and Medication administration.  Evaluation of Outcomes: Progressing  Physician Treatment Plan for Secondary Diagnosis: Active Problems:   PTSD (post-traumatic stress disorder)  Long Term Goal(s): Improvement in symptoms so as ready for discharge Improvement in symptoms so as ready for discharge   Short Term Goals: Ability to identify changes in lifestyle to reduce recurrence of condition will improve Ability to verbalize feelings will improve Ability to disclose and discuss suicidal ideas Ability to demonstrate self-control will improve Ability to identify and develop effective coping behaviors will improve Ability to maintain clinical measurements within normal limits will improve Ability to identify changes in lifestyle to reduce recurrence of condition will improve Ability to verbalize feelings will improve Ability to disclose and discuss suicidal ideas Ability to demonstrate self-control will improve Ability to identify and develop effective coping behaviors will improve Ability to maintain clinical measurements within normal limits will improve     Medication Management: Evaluate patient's response, side effects, and tolerance of medication regimen.  Therapeutic Interventions: 1 to 1 sessions, Unit Group sessions and Medication administration.  Evaluation of Outcomes: Progressing   RN Treatment Plan for Primary Diagnosis: <principal problem not specified> Long Term Goal(s): Knowledge of disease and therapeutic regimen to maintain health will improve  Short Term Goals: Ability to verbalize feelings will improve, Ability to identify and develop effective coping behaviors will improve and Compliance with prescribed medications will improve  Medication Management: RN will administer medications as ordered by provider, will assess and evaluate patient's response and provide education to patient for  prescribed medication. RN will report any adverse and/or side effects to prescribing provider.  Therapeutic Interventions: 1 on 1 counseling sessions, Psychoeducation, Medication administration, Evaluate responses to treatment, Monitor vital signs and CBGs as ordered, Perform/monitor CIWA, COWS, AIMS and Fall Risk screenings as ordered, Perform wound care treatments as ordered.  Evaluation of Outcomes: Progressing   LCSW Treatment Plan for Primary Diagnosis: <principal problem not specified> Long Term Goal(s): Safe transition to appropriate next level of care at discharge, Engage patient in therapeutic group addressing interpersonal concerns.  Short Term Goals: Engage patient in aftercare planning with referrals and resources, Increase social support, Identify triggers associated with mental health/substance abuse issues and Increase skills for wellness and recovery  Therapeutic Interventions: Assess for all discharge needs, 1 to 1 time with Social worker, Explore available resources and support systems, Assess for adequacy in community support network, Educate family and significant other(s) on suicide prevention, Complete Psychosocial Assessment, Interpersonal group therapy.  Evaluation of Outcomes: Progressing   Progress in Treatment: Attending groups: Yes. Participating in groups: Yes. Taking medication as prescribed: Yes. Toleration medication: Yes. Family/Significant other contact made: No, will contact: fiance Patient understands diagnosis: Yes. Discussing patient identified problems/goals with staff: Yes. Medical problems stabilized or resolved: Yes. Denies suicidal/homicidal ideation: Yes. Issues/concerns per patient self-inventory: Yes.  New problem(s) identified: Yes, Describe:  financial stressors, family stressors  New Short Term/Long Term Goal(s): detox, medication management for mood stabilization; elimination of SI thoughts; development of comprehensive mental  wellness/sobriety plan.  Patient Goals:  "Feel stress free, move  on."  Discharge Plan or Barriers: Plans to return home and follow up with outpatient providers  Reason for Continuation of Hospitalization: Anxiety Depression Medical Issues Medication stabilization  Estimated Length of Stay: 1-2 days  Attendees: Patient: Larry Dean 06/19/2019 9:00 AM  Physician: Javier Dockerr.Cobos 06/19/2019 9:00 AM  Nursing: Lanora ManisElizabeth, RN 06/19/2019 9:00 AM  RN Care Manager: 06/19/2019 9:00 AM  Social Worker: Enid Cutterharlotte Nhu Glasby, LCSWA 06/19/2019 9:00 AM  Recreational Therapist:  06/19/2019 9:00 AM  Other:  06/19/2019 9:00 AM  Other:  06/19/2019 9:00 AM  Other: 06/19/2019 9:00 AM    Scribe for Treatment Team: Darreld Mcleanharlotte C Justyna Timoney, LCSWA 06/19/2019 9:00 AM
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
## 2019-06-19 NOTE — Progress Notes (Signed)
DAR NOTE: Patient presents with calm affect and pleasant mood.  Described energy level as normal and concentration as good.  Denies suicidal thoughts, pain, auditory and visual hallucinations.  Rates depression at 0, hopelessness at 0, and anxiety at 0.  Maintained on routine safety checks.  Medications given as prescribed.  Support and encouragement offered as needed.  Attended group and participated.  States goal for today is "to get better and work on my thoughts."  Patient observed socializing with peers in the dayroom.  Offered no complaint.  Patient is safe on and off the unit.

## 2019-06-19 NOTE — [facility] Group Notes (Signed)
[facility] Group Notes:  (Nursing/MHT/Case Management/Adjunct)  Date:  06/19/2019  Time:  10:00 AM  Type of Therapy:  Nurse Education  Participation Level:  Did Not Attend  Baron Sane 06/19/2019, 11:17 AM

## 2019-06-19 NOTE — Progress Notes (Signed)
Recreation Therapy Notes  Date:  8.21.20 Time: 0930 Location: [address] Dayroom  Group Topic: Stress Management  Goal Area(s) Addresses:  Patient will identify positive stress management techniques. Patient will identify benefits of using stress management post d/c.  Intervention:  Stress Management  Activity : Guided Imagery.  LRT introduced the stress management technique of guided imagery.  LRT read a script that took patients on a journey to enjoy the peaceful waves at the beach.  Education:  Stress Management, Discharge Planning.   Education Outcome: Acknowledges Education  Clinical Observations/Feedback: Pt did not attend group.     Victorino Sparrow, LRT/CTRS         Victorino Sparrow A 06/19/2019 10:28 AM
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
## 2019-06-20 DIAGNOSIS — F332 Major depressive disorder, recurrent severe without psychotic features: Secondary | ICD-10-CM | POA: Diagnosis not present

## 2019-06-20 DIAGNOSIS — F4310 Post-traumatic stress disorder, unspecified: Secondary | ICD-10-CM | POA: Diagnosis not present

## 2019-06-20 DIAGNOSIS — R45850 Homicidal ideations: Secondary | ICD-10-CM | POA: Diagnosis not present

## 2019-06-20 NOTE — Progress Notes (Signed)
D. Pt is pleasant upon approach- smiles- per pt's self inventory,  pt rated his depression, hopelessness and anxiety all 0's. Pt writes that his goal today is "to leave and have a good day".  Pt currently denies SI/HI and AVH   A. Labs and vitals monitored. Pt compliant with medications. Pt supported emotionally and encouraged to express concerns and ask questions.   R. Pt remains safe with 15 minute checks. Will continue POC.
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
## 2019-06-20 NOTE — [facility] Group Notes (Signed)
LCSW Group Therapy Note  Date and Time: 06/20/2019 @ 10:00am  Type of Therapy and Topic: Group Therapy: Feelings Around Returning Home & Establishing a Supportive Framework and Supporting Oneself When Supports Not Available  Participation Level: [facility] PARTICIPATION LEVEL: Did Not Attend  Mood: Did not attend     Description of Group:  Patients first processed thoughts and feelings about upcoming discharge. These included fears of upcoming changes, lack of change, new living environments, judgements and expectations from others and overall stigma of mental health issues. The group then discussed the definition of a supportive framework, what that looks and feels like, and how do to discern it from an unhealthy non-supportive network. The group identified different types of supports as well as what to do when your family/friends are less than helpful or unavailable     Therapeutic Goals   1.  Patient will identify one healthy supportive network that they can use at discharge.  2.  Patient will identify one factor of a supportive framework and how to tell it from an unhealthy network.  3.  Patient able to identify one coping skill to use when they do not have positive supports from others.  4.  Patient will demonstrate ability to communicate their needs through discussion and/or role plays.     Summary of Patient Progress:       Patient did not attend group today.      Therapeutic Modalities  Cognitive Behavioral Therapy  Motivational Interviewing   Tippi Mccrae, MSW, LCSW   

## 2019-06-20 NOTE — Progress Notes (Signed)
D. Pt pleasant on approach, complaint of continued mild headache.  Pt positive for evening wrap up group, observed appropriately engaged with peers on the unit.  Pt denies SI/HI/AVH at this time.  A.  Support and encouragement offered, medication given as ordered  R.  Pt remains safe on the unit, will continue to monitor.

## 2019-06-20 NOTE — Progress Notes (Signed)
[place] NOVEL CORONAVIRUS (COVID-19) DAILY CHECK-OFF SYMPTOMS - answer yes or no to each - every day NO YES  Have you had a fever in the past 24 hours?  . Fever (Temp > 37.80C / 100F) X   Have you had any of these symptoms in the past 24 hours? . New Cough .  Sore Throat  .  Shortness of Breath .  Difficulty Breathing .  Unexplained Body Aches   X   Have you had any one of these symptoms in the past 24 hours not related to allergies?   . Runny Nose .  Nasal Congestion .  Sneezing   X   If you have had runny nose, nasal congestion, sneezing in the past 24 hours, has it worsened?  X   EXPOSURES - check yes or no X   Have you traveled outside the state in the past 14 days?  X   Have you been in contact with someone with a confirmed diagnosis of COVID-19 or PUI in the past 14 days without wearing appropriate PPE?  X   Have you been living in the same home as a person with confirmed diagnosis of COVID-19 or a PUI (household contact)?    X   Have you been diagnosed with COVID-19?    X              What to do next: Answered NO to all: Answered YES to anything:   Proceed with unit schedule Follow the [facility] Inpatient Flowsheet.   

## 2019-06-20 NOTE — [facility] Group Notes (Signed)
[facility] Group Notes:  (Nursing/MHT/Case Management/Adjunct)  Date:  06/20/2019  Time:  1330  Type of Therapy:  Nurse Education  Participation Level:  Active  Participation Quality:  Appropriate  Affect:  Appropriate  Cognitive:  Appropriate  Insight:  Appropriate  Engagement in Group:  Engaged  Modes of Intervention:  Discussion and Education  Summary of Progress/Problems:  Marissa Calamity 06/20/2019,

## 2019-06-21 DIAGNOSIS — F332 Major depressive disorder, recurrent severe without psychotic features: Secondary | ICD-10-CM

## 2019-06-21 DIAGNOSIS — R45850 Homicidal ideations: Secondary | ICD-10-CM

## 2019-06-21 MED ORDER — PRAZOSIN HCL 1 MG PO CAPS: 1.0000 mg | ORAL_CAPSULE | Freq: Every day | ORAL | 0 refills | Status: AC

## 2019-06-21 MED ORDER — FLUOXETINE HCL 20 MG PO CAPS: 20.0000 mg | ORAL_CAPSULE | Freq: Every day | ORAL | 0 refills | Status: AC

## 2019-06-21 NOTE — Progress Notes (Signed)
Pt discharged to lobby- fiance present. Pt was stable and appreciative at that time. All papers were given and valuables returned. Verbal understanding expressed. Denies SI/HI and A/VH. Pt given opportunity to express concerns and ask questions.

## 2019-06-21 NOTE — Progress Notes (Signed)
[place] NOVEL CORONAVIRUS (COVID-19) DAILY CHECK-OFF SYMPTOMS - answer yes or no to each - every day NO YES  Have you had a fever in the past 24 hours?  . Fever (Temp > 37.80C / 100F) X   Have you had any of these symptoms in the past 24 hours? . New Cough .  Sore Throat  .  Shortness of Breath .  Difficulty Breathing .  Unexplained Body Aches   X   Have you had any one of these symptoms in the past 24 hours not related to allergies?   . Runny Nose .  Nasal Congestion .  Sneezing   X   If you have had runny nose, nasal congestion, sneezing in the past 24 hours, has it worsened?  X   EXPOSURES - check yes or no X   Have you traveled outside the state in the past 14 days?  X   Have you been in contact with someone with a confirmed diagnosis of COVID-19 or PUI in the past 14 days without wearing appropriate PPE?  X   Have you been living in the same home as a person with confirmed diagnosis of COVID-19 or a PUI (household contact)?    X   Have you been diagnosed with COVID-19?    X              What to do next: Answered NO to all: Answered YES to anything:   Proceed with unit schedule Follow the [facility] Inpatient Flowsheet.   

## 2019-06-21 NOTE — Progress Notes (Signed)
D. Pt is friendly and polite upon approach, observed in the milieu interacting appropriately with peers and staff. Per pt's self inventory, pt rated his depression, hopelessness and anxiety all 0's. 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 Pt currently denies SI/HI and AVH A. Labs and vitals monitored. Pt compliant with medications. Pt supported emotionally and encouraged to express concerns and ask questions.   R. Pt remains safe with 15 minute checks. Will continue POC.

## 2019-06-21 NOTE — Progress Notes (Signed)
  [facility] Adult Case Management Discharge Plan :  Will you be returning to the same living situation after discharge:  Yes,  home At discharge, do you have transportation home?: Yes,  fiance will pick up at 10;30am Do you have the ability to pay for your medications: Yes,  [employer] insurance  Release of information consent forms completed and in the chart. Letter on chart.  Patient to Follow up at: Follow-up Information    [facility] Follow up on 06/25/2019.   Why: Medication management with Dr. Jerline Pain is Thursday, 8/27 at 2:30p. Please bring your current medications Contact information: [address] [place] [address] ph: [phone] fx: [phone]       [facility] Follow up.   Why: Social Civil engineer, contracting attempted to Librarian, academic. Office is closed over weekend. Please follow up with your therapist on Monday morning to get a therapy appointment.  Contact information: [place] [address] ph: [phone] fx: n/a          Next level of care provider has access to [place] and Suicide Prevention discussed: Yes,  with fiance, Katie.  Have you used any form of tobacco in the last 30 days? (Cigarettes, Smokeless Tobacco, Cigars, and/or Pipes): No  Has patient been referred to the Quitline?: N/A patient is not a smoker  Patient has been referred for addiction treatment: Yes  Joellen Jersey, [place] 06/21/2019, 9:50 AM
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
## 2019-06-21 NOTE — [facility] Suicide Risk Assessment (Signed)
[facility] Discharge Suicide Risk Assessment   Principal Problem: MDD, PTSD  Discharge Diagnoses: Active Problems:   PTSD (post-traumatic stress disorder)   Total Time spent with patient: 30 minutes  Musculoskeletal: Strength & Muscle Tone: within normal limits Gait & Station: normal Patient leans: N/A  Psychiatric Specialty Exam: ROS no headache, no chest pain, no shortness of breath, no cough, no vomiting, no fever or chills   Blood pressure 133/79, pulse 72, temperature 98.7 F (37.1 C), resp. rate 20, height 5\' 10"  (1.778 m), weight 89.8 kg, SpO2 98 %.Body mass index is 28.41 kg/m.  General Appearance: Well Groomed  Engineer, water::  Good  Speech:  Normal Rate409  Volume:  Normal  Mood:  Euthymic  Affect:  appropriate and fully reactive   Thought Process:  Linear and Descriptions of Associations: Intact  Orientation:  Full (Time, Place, and Person)  Thought Content:  no hallucinations, no delusions   Suicidal Thoughts:  No denies suicidal or self injurious ideations, denies any homicidal or violent ideations , and specifically also  denies violent or homicidal ideations towards mother of his children, states " I just plan to avoid her, my GF got a 50 B ".   Homicidal Thoughts:  No  Memory:  recent and remote grossly intact   Judgement:  Other:  improving   Insight:  improving   Psychomotor Activity:  Normal  Concentration:  Good  Recall:  Good  Fund of Knowledge:Good  Language: Good  Akathisia:  Negative  Handed:  Right  AIMS (if indicated):     Assets:  Communication Skills Desire for Improvement Resilience  Sleep:  Number of Hours: 6  Cognition: WNL  ADL's:  Intact   Mental Status Per Nursing Assessment::   On Admission:  Intention to act on plan to harm others  Demographic Factors:  38 y old male, lives with GF, has four children  Loss Factors: Reports he was being harassed by the mother of two of his children  Historical Factors: No prior psychiatric  admissions, no history of suicide attempts, denies history of violence , history of PTSD symptoms  Risk Reduction Factors:   Responsible for children under 8 years of age, Living with another person, especially a relative and Positive coping skills or problem solving skills  Continued Clinical Symptoms:  Alert, attentive, calm, mood improved , currently euthymic, with a full range of affect, no thought disorder, denies SI or HI, and specifically denies any violent or homicidal ideations towards the mother of his children, currently calm, pleasant, future oriented, states he plans to relocate to [place] with his GF in December . Denies medication side effects, side effect profile reviewed. Behavior on unit is in good control.   Cognitive Features That Contribute To Risk:  No gross cognitive deficits noted upon discharge. Is alert , attentive, and oriented x 3   Suicide Risk:  Mild:  Suicidal ideation of limited frequency, intensity, duration, and specificity.  There are no identifiable plans, no associated intent, mild dysphoria and related symptoms, good self-control (both objective and subjective assessment), few other risk factors, and identifiable protective factors, including available and accessible social support.  Follow-up Information    [facility] Follow up on 06/25/2019.   Why: Medication management with Dr. Jerline Pain is Thursday, 8/27 at 2:30p. Please bring your current medications Contact information: [address] [place] [address] ph: [phone] fx: [phone]       [facility] Follow up.   Why: Social  Buyer, retailworker assistant attempted to contact therapist. Office is closed over weekend. Please follow up with your therapist on Monday morning to get a therapy appointment.  Contact information: [address] [place] [address] ph: [phone] fx: n/a          Plan Of Care/Follow-up recommendations:  Activity:   as tolerated Diet:  regular Tests:  NA Other:  See below  Patient is expressing readiness for discharge and is leaving unit in good spirits  Plans to return home Follow up as above  Has an established PCP , Dr. Leavy CellaBoyd , in [facility] for medical management as needed.  Craige CottaFernando A Ardella Chhim, MD 06/21/2019, 9:35 AM

## 2019-06-21 NOTE — [facility] Group Notes (Signed)
LCSW Group Therapy Note  06/21/2019 9:06 AM  Type of Therapy/Topic: Group Therapy: Feelings about Diagnosis  Participation Level: Minimal   Description of Group:  This group will allow patients to explore their thoughts and feelings about diagnoses they have received. Patients will be guided to explore their level of understanding and acceptance of these diagnoses. Facilitator will encourage patients to process their thoughts and feelings about the reactions of others to their diagnosis and will guide patients in identifying ways to discuss their diagnosis with significant others in their lives. This group will be process-oriented, with patients participating in exploration of their own experiences, giving and receiving support, and processing challenge from other group members.  Therapeutic Goals: 1. Patient will demonstrate understanding of diagnosis as evidenced by identifying two or more symptoms of the disorder 2. Patient will be able to express two feelings regarding the diagnosis 3. Patient will demonstrate their ability to communicate their needs through discussion and/or role play  Summary of Patient Progress: Patient left early for discharge, while present he listened respectfully to other participants.  Therapeutic Modalities:  Cognitive Behavioral Therapy Brief Therapy Feelings Identification   Stephanie Acre, MSW, [place] Social Worker

## 2019-07-10 ENCOUNTER — Emergency Department (HOSPITAL_COMMUNITY): Payer: Managed Care, Other (non HMO)

## 2019-07-10 ENCOUNTER — Encounter (HOSPITAL_COMMUNITY): Payer: Self-pay | Admitting: *Deleted

## 2019-07-10 ENCOUNTER — Other Ambulatory Visit: Payer: Self-pay

## 2019-07-10 ENCOUNTER — Emergency Department (HOSPITAL_COMMUNITY)
Admission: EM | Admit: 2019-07-10 | Discharge: 2019-07-10 | Disposition: A | Discharge status: Home / Self Care | Payer: Managed Care, Other (non HMO) | Attending: Emergency Medicine | Admitting: Emergency Medicine

## 2019-07-10 DIAGNOSIS — R200 Anesthesia of skin: Secondary | ICD-10-CM | POA: Diagnosis present

## 2019-07-10 DIAGNOSIS — R202 Paresthesia of skin: Secondary | ICD-10-CM | POA: Diagnosis not present

## 2019-07-10 DIAGNOSIS — Z8673 Personal history of transient ischemic attack (TIA), and cerebral infarction without residual deficits: Secondary | ICD-10-CM | POA: Diagnosis not present

## 2019-07-10 DIAGNOSIS — Z79899 Other long term (current) drug therapy: Secondary | ICD-10-CM | POA: Diagnosis not present

## 2019-07-10 DIAGNOSIS — I10 Essential (primary) hypertension: Secondary | ICD-10-CM | POA: Diagnosis not present

## 2019-07-10 HISTORY — DX: Post-traumatic stress disorder, unspecified: F43.10

## 2019-07-10 LAB — COMPREHENSIVE METABOLIC PANEL
ALT: 23 U/L (ref 0–44)
AST: 21 U/L (ref 15–41)
Albumin: 3.8 g/dL (ref 3.5–5.0)
Alkaline Phosphatase: 62 U/L (ref 38–126)
Anion gap: 11 (ref 5–15)
BUN: 10 mg/dL (ref 6–20)
CO2: 21 mmol/L — ABNORMAL LOW (ref 22–32)
Calcium: 9.3 mg/dL (ref 8.9–10.3)
Chloride: 109 mmol/L (ref 98–111)
Creatinine, Ser: 1.2 mg/dL (ref 0.61–1.24)
GFR calc Af Amer: >60 mL/min (ref >60)
GFR calc non Af Amer: >60 mL/min (ref >60)
Glucose, Bld: 86 mg/dL (ref 70–99)
Potassium: 3.6 mmol/L (ref 3.5–5.1)
Sodium: 141 mmol/L (ref 135–145)
Total Bilirubin: 0.6 mg/dL (ref 0.3–1.2)
Total Protein: 7.1 g/dL (ref 6.5–8.1)

## 2019-07-10 LAB — CBC
HCT: 41.9 % (ref 39.0–52.0)
Hemoglobin: 14.4 g/dL (ref 13.0–17.0)
MCH: 29.3 pg (ref 26.0–34.0)
MCHC: 34.4 g/dL (ref 30.0–36.0)
MCV: 85.2 fL (ref 80.0–100.0)
Platelets: 298 10*3/uL (ref 150–400)
RBC: 4.92 MIL/uL (ref 4.22–5.81)
RDW: 11.8 % (ref 11.5–15.5)
WBC: 3.4 10*3/uL — ABNORMAL LOW (ref 4.0–10.5)
nRBC: 0 % (ref 0.0–0.2)

## 2019-07-10 LAB — CBG MONITORING, ED: Glucose-Capillary: 82 mg/dL (ref 70–99)

## 2019-07-10 LAB — APTT: aPTT: 35 seconds (ref 24–36)

## 2019-07-10 LAB — DIFFERENTIAL
Abs Immature Granulocytes: 0.01 10*3/uL (ref 0.00–0.07)
Basophils Absolute: 0 10*3/uL (ref 0.0–0.1)
Basophils Relative: 1 %
Eosinophils Absolute: 0 10*3/uL (ref 0.0–0.5)
Eosinophils Relative: 1 %
Immature Granulocytes: 0 %
Lymphocytes Relative: 39 %
Lymphs Abs: 1.3 10*3/uL (ref 0.7–4.0)
Monocytes Absolute: 0.3 10*3/uL (ref 0.1–1.0)
Monocytes Relative: 9 %
Neutro Abs: 1.8 10*3/uL (ref 1.7–7.7)
Neutrophils Relative %: 50 %

## 2019-07-10 LAB — I-STAT CHEM 8, ED
BUN: 11 mg/dL (ref 6–20)
Calcium, Ion: 1.09 mmol/L — ABNORMAL LOW (ref 1.15–1.40)
Chloride: 106 mmol/L (ref 98–111)
Creatinine, Ser: 1.1 mg/dL (ref 0.61–1.24)
Glucose, Bld: 84 mg/dL (ref 70–99)
HCT: 43 % (ref 39.0–52.0)
Hemoglobin: 14.6 g/dL (ref 13.0–17.0)
Potassium: 3.5 mmol/L (ref 3.5–5.1)
Sodium: 140 mmol/L (ref 135–145)
TCO2: 21 mmol/L — ABNORMAL LOW (ref 22–32)

## 2019-07-10 LAB — PROTIME-INR
INR: 1 (ref 0.8–1.2)
Prothrombin Time: 13.3 seconds (ref 11.4–15.2)

## 2019-07-10 LAB — ETHANOL: Alcohol, Ethyl (B): <10 mg/dL (ref <10)

## 2019-07-10 MED ORDER — SODIUM CHLORIDE 0.9 % IV BOLUS: 1000.0000 mL | Freq: Once | INTRAVENOUS | Status: DC

## 2019-07-10 NOTE — ED Notes (Signed)
Canceled code stroke with phil from [place]

## 2019-07-10 NOTE — ED Provider Notes (Signed)
MSE was initiated and I personally evaluated the patient and placed orders (if any) at  12:32 PM on July 10, 2019.  The patient appears stable so that the remainder of the MSE may be completed by another provider.  At 1145 patient had onset of left-sided numbness and weakness.  He reportedly had been having a stressful interaction with the mother of his children.  He reports he started to feel like everything on the left side had decreased sensation.  He denies he has a headache.  Patient is awake and answering questions appropriately.  He seems just slightly somnolent.  Airway is intact.  Heart is regular with good peripheral pulses.  Patient's grip strength on the left is 3\5 to the posterior right at 5\5.  Patient is able to elevate and temporarily hold left lower extremity off the bed but is weaker to downward pressure than the right.  No visual field cuts.  Extraocular motions intact.  EMS reports blood pressures were normotensive during transport.  With objective motor weakness that is left upper and lower extremity will activate code stroke.   Charlesetta Shanks, MD 07/10/19 1236

## 2019-07-10 NOTE — ED Notes (Signed)
Pt denies any s/s at this time.

## 2019-07-10 NOTE — ED Notes (Signed)
Activated code stroke with phil from [facility]  

## 2019-07-10 NOTE — ED Triage Notes (Signed)
Pt here via

## 2019-07-10 NOTE — ED Provider Notes (Signed)
[facility] EMERGENCY DEPARTMENT Provider Note   CSN: [id number] Arrival date & time: 07/10/19  1228  An emergency department physician performed an initial assessment on this suspected stroke patient at 65.  History   Chief Complaint Chief Complaint  Patient presents with   Numbness    HPI Larry Dean is a 38 y.o. male.     HPI  38 year old male presents with left-sided numbness.  Started at around 1140 today.  Was having an argument when it started.  Arms and legs.  No headache. Code stroke called after initial screening by ED doc in different pod.  Patient tells me that he felt like he could not walk.  Numbness/weakness to the left upper extremity and left lower extremity.  No chest pain but he felt like his heart rate was rapid.  Past Medical History:  Diagnosis Date   GERD (gastroesophageal reflux disease)    Hypertension    PTSD (post-traumatic stress disorder)    Sleep apnea     Patient Active Problem List   Diagnosis Date Noted   Severe episode of recurrent major depressive disorder, without psychotic features ([place])    Homicidal ideations    PTSD (post-traumatic stress disorder) 06/17/2019   Obesity 09/17/2016   Bell's palsy 10/27/2012   TIA (transient ischemic attack) 10/26/2012   HTN (hypertension), malignant 10/26/2012   Headache(784.0) 10/26/2012   Hypertensive urgency 10/26/2012    Past Surgical History:  Procedure Laterality Date   CIRCUMCISION  AGE 16 OR 15   LAPAROSCOPIC GASTRIC SLEEVE RESECTION N/A 09/17/2016   Procedure: LAPAROSCOPIC GASTRIC SLEEVE RESECTION WITH UPPPER ENDO;  Surgeon: Arta Bruce Kinsinger, MD;  Location: [facility];  Service: General;  Laterality: N/A;   UPPER GI ENDOSCOPY  09/17/2016   Procedure: UPPER GI ENDOSCOPY;  Surgeon: Arta Bruce Kinsinger, MD;  Location: [facility];  Service: General;;        Home Medications    Prior to Admission medications   Medication Sig Start Date End Date  Taking? Authorizing Provider  FLUoxetine (PROZAC) 20 MG capsule Take 1 capsule (20 mg total) by mouth daily. 06/22/19   Connye Burkitt, NP  prazosin (MINIPRESS) 1 MG capsule Take 1 capsule (1 mg total) by mouth at bedtime. 06/21/19   Connye Burkitt, NP    Family History Family History  Problem Relation Age of Onset   Cancer Father     Social History Social History   Tobacco Use   Smoking status: Never Smoker   Smokeless tobacco: Never Used  Substance Use Topics   Alcohol use: Not Currently    Comment: once a week   Drug use: No     Allergies   Ace inhibitors   Review of Systems Review of Systems  Cardiovascular: Negative for chest pain.  Neurological: Positive for weakness and numbness. Negative for headaches.  All other systems reviewed and are negative.    Physical Exam Updated Vital Signs BP (!) 148/93 (BP Location: Right Arm)    Pulse 70    Temp (!) 97.5 F (36.4 C) (Oral)    Resp (!) 23    Ht 5\' 10"  (1.778 m)    Wt 84.5 kg    SpO2 98%    BMI 26.73 kg/m   Physical Exam Vitals signs and nursing note reviewed.  Constitutional:      General: He is not in acute distress.    Appearance: He is well-developed. He is not ill-appearing or diaphoretic.  HENT:  Head: Normocephalic and atraumatic.     Right Ear: External ear normal.     Left Ear: External ear normal.     Nose: Nose normal.  Eyes:     General:        Right eye: No discharge.        Left eye: No discharge.  Neck:     Musculoskeletal: Neck supple.  Cardiovascular:     Rate and Rhythm: Normal rate and regular rhythm.     Heart sounds: Normal heart sounds.  Pulmonary:     Effort: Pulmonary effort is normal.     Breath sounds: Normal breath sounds.  Abdominal:     Palpations: Abdomen is soft.     Tenderness: There is no abdominal tenderness.  Skin:    General: Skin is warm and dry.  Neurological:     Mental Status: He is alert.     Comments: CN 3-12 grossly intact. 5/5 strength in right  sided extremities. 4/5 in LUE, LLE. Normal finger to nose.   Psychiatric:        Mood and Affect: Mood is not anxious.      ED Treatments / Results  Labs (all labs ordered are listed, but only abnormal results are displayed) Labs Reviewed  COMPREHENSIVE METABOLIC PANEL - Abnormal; Notable for the following components:      Result Value   CO2 21 (*)    All other components within normal limits  CBC - Abnormal; Notable for the following components:   WBC 3.4 (*)    All other components within normal limits  I-STAT CHEM 8, ED - Abnormal; Notable for the following components:   Calcium, Ion 1.09 (*)    TCO2 21 (*)    All other components within normal limits  ETHANOL  PROTIME-INR  APTT  DIFFERENTIAL  RAPID URINE DRUG SCREEN, HOSP PERFORMED  URINALYSIS, ROUTINE W REFLEX MICROSCOPIC  CBG MONITORING, ED    EKG EKG Interpretation  Date/Time:  Friday July 10 2019 12:45:16 EDT Ventricular Rate:  58 PR Interval:    QRS Duration: 100 QT Interval:  389 QTC Calculation: 382 R Axis:   -9 Text Interpretation:  Sinus rhythm no acute St/T changes T wave changes no longer present compared to Aug 2020 Confirmed by Pricilla LovelessGoldston, Ardith Test [phone]([id number]) on 07/10/2019 1:36:08 PM   Radiology Mr Brain Wo Contrast  Result Date: 07/10/2019 CLINICAL DATA:  Focal neuro deficit less than 6 hours. Rule out stroke. Left-sided numbness and weakness. EXAM: MRI HEAD WITHOUT CONTRAST TECHNIQUE: Multiplanar, multiecho pulse sequences of the brain and surrounding structures were obtained without intravenous contrast. COMPARISON:  CT head 07/10/2019 FINDINGS: Brain: Negative for acute infarct. Ventricle size normal. Negative for hemorrhage or mass. Two or 3 tiny white matter hyperintensities in the frontal lobes bilaterally nonspecific. Vascular: Normal arterial flow voids Skull and upper cervical spine: Negative Sinuses/Orbits: Mild mucosal edema paranasal sinuses.  Normal orbit Other: None IMPRESSION: No acute  abnormality Electronically Signed   By: Marlan Palauharles  Clark M.D.   On: 07/10/2019 14:41   Ct Head Code Stroke Wo Contrast  Result Date: 07/10/2019 CLINICAL DATA:  Code stroke.  Left-sided weakness. EXAM: CT HEAD WITHOUT CONTRAST TECHNIQUE: Contiguous axial images were obtained from the base of the skull through the vertex without intravenous contrast. COMPARISON:  CT HEAD WITHOUT CONTRAST 10/26/2012 FINDINGS: Brain: No acute infarct, hemorrhage, or mass lesion is present. No significant white matter lesions are present. The ventricles are of normal size. The ventricles are of normal size. No  significant extraaxial fluid collection is present. The brainstem and cerebellum are within normal limits. Vascular: No hyperdense vessel or unexpected calcification. Skull: Calvarium is intact. No focal lytic or blastic lesions are present. Sinuses/Orbits: The paranasal sinuses and mastoid air cells are clear. The globes and orbits are within normal limits. ASPECTS [facility] Stroke Program Early CT Score) - Ganglionic level infarction (caudate, lentiform nuclei, internal capsule, insula, M1-M3 cortex): 7/7 - Supraganglionic infarction (M4-M6 cortex): 3/3 Total score (0-10 with 10 being normal): 10/10 IMPRESSION: 1. Normal CT of the head without contrast 2. ASPECTS is 10/10 The above was relayed via text pager to Dr. Pricilla Loveless on 07/10/2019 at 12:45 . Electronically Signed   By: Marin Roberts M.D.   On: 07/10/2019 12:46    Procedures Procedures (including critical care time)  Medications Ordered in ED Medications  sodium chloride 0.9 % bolus 1,000 mL (has no administration in time range)     Initial Impression / Assessment and Plan / ED Course  I have reviewed the triage vital signs and the nursing notes.  Pertinent labs & imaging results that were available during my care of the patient were reviewed by me and considered in my medical decision making (see chart for details).        After initial  work-up, code stroke was canceled.  There is a little bit of "giveaway weakness".  With all of these symptoms, MRI was obtained given no resolution.  This is unremarkable.  No neck pain or symptoms.  Appears stable for discharge home.  At the time of discharge his symptoms have improved.  I think this is more functional/related to the stressful event that occurred this morning.  Final Clinical Impressions(s) / ED Diagnoses   Final diagnoses:  Paresthesia    ED Discharge Orders    None       Pricilla Loveless, MD 07/10/19 [phone]

## 2019-07-10 NOTE — Discharge Instructions (Signed)
If you develop headache, fever, weakness or numbness, trouble speaking or breathing, blurry or change in vision, or any other new/concerning symptoms then return to the ER or call 911.

## 2019-07-10 NOTE — Consult Note (Signed)
Neurology Consultation  Reason for Consult: Code stroke Referring Physician: Dr. Criss AlvineGoldston  CC: Left-sided weakness/numbness  History is obtained from: EMS, patient  HPI: Maud DeedJonte L Pfohl is a 38 y.o. male past medical history of PTSD with a recent admission in August 2020 for homicidal ideation towards mother of his children, hypertension, brought in for sudden onset of left-sided numbness after he got into an argument with someone at home. He reports that he was trying to cool himself and calm down from the argument but could not really feel his left side as well as equal to his right side. EMS was called and brought him in. Examined at the bridge and taken in for a stat CT head. Examined functional components-for which reason, TPA was not offered.  No LVO signs on exam hence not a candidate for EVT.   LKW: 11:45 AM on 07/10/2019 tpa given?: no, functional exam Premorbid modified Rankin scale (mRS): 0  ROS:ROS was performed and is negative except as noted in the HPI.  Past Medical History:  Diagnosis Date  . GERD (gastroesophageal reflux disease)   . Hypertension   . PTSD (post-traumatic stress disorder)   . Sleep apnea    Family History  Problem Relation Age of Onset  . Cancer Father     Social History:   reports that he has never smoked. He has never used smokeless tobacco. He reports previous alcohol use. He reports that he does not use drugs.  Medications  Current Facility-Administered Medications:  .  sodium chloride 0.9 % bolus 1,000 mL, 1,000 mL, Intravenous, Once, Pricilla LovelessGoldston, Scott, MD  Current Outpatient Medications:  .  FLUoxetine (PROZAC) 20 MG capsule, Take 1 capsule (20 mg total) by mouth daily., Disp: 30 capsule, Rfl: 0 .  prazosin (MINIPRESS) 1 MG capsule, Take 1 capsule (1 mg total) by mouth at bedtime., Disp: 30 capsule, Rfl: 0  Exam: Current vital signs: BP (!) 148/93 (BP Location: Right Arm)   Pulse 70   Temp (!) 97.5 F (36.4 C) (Oral)   Resp (!) 23    Ht 5\' 10"  (1.778 m)   Wt 84.5 kg   SpO2 98%   BMI 26.73 kg/m  Vital signs in last 24 hours: Temp:  [97.5 F (36.4 C)] 97.5 F (36.4 C) (09/11 1249) Pulse Rate:  [70] 70 (09/11 1249) Resp:  [23] 23 (09/11 1249) BP: (148)/(93) 148/93 (09/11 1249) SpO2:  [98 %] 98 % (09/11 1249) Weight:  [84.5 kg] 84.5 kg (09/11 1251)  GENERAL: Awake, alert in NAD HEENT: - Normocephalic and atraumatic, dry mm, no LN++, no Thyromegally LUNGS - Clear to auscultation bilaterally with no wheezes CV - S1S2 RRR, no m/r/g, equal pulses bilaterally. ABDOMEN - Soft, nontender, nondistended with normoactive BS Ext: warm, well perfused, intact peripheral pulses, no edema  NEURO:  Mental Status: AA&Ox3  Language: speech is non-dysarthric.  Naming, repetition, fluency, and comprehension intact. Cranial Nerves: PERR. EOMI, visual fields full, no facial asymmetry,facial sensation intact, hearing intact, tongue/uvula/soft palate midline, normal sternocleidomastoid and trapezius muscle strength. No evidence of tongue atrophy or fibrillations Motor: Left upper extremity-initially said he could not move but upon coaching, full strength and no vertical drift.  Left lower extremity-did not give any effort-positive Hoover sign.  After about a few minutes was able to lift the left leg up and keep it up without vertical drift.  Right upper and lower extremity full-strength. Tone: is normal and bulk is normal Sensation-decreased on left hemibody with sharp cord and midline Coordination:  FTN intact bilaterally, no ataxia in BLE. Gait- deferred  NIHSS-1 for sensory   Labs I have reviewed labs in epic and the results pertinent to this consultation are:  CBC    Component Value Date/Time   WBC 3.4 (L) 07/10/2019 1235   RBC 4.92 07/10/2019 1235   HGB 14.6 07/10/2019 1235   HGB 14.4 07/10/2019 1235   HCT 43.0 07/10/2019 1235   HCT 41.9 07/10/2019 1235   PLT 298 07/10/2019 1235   MCV 85.2 07/10/2019 1235   MCH 29.3  07/10/2019 1235   MCHC 34.4 07/10/2019 1235   RDW 11.8 07/10/2019 1235   LYMPHSABS 1.3 07/10/2019 1235   MONOABS 0.3 07/10/2019 1235   EOSABS 0.0 07/10/2019 1235   BASOSABS 0.0 07/10/2019 1235    CMP     Component Value Date/Time   NA 140 07/10/2019 1235   NA 141 07/10/2019 1235   K 3.5 07/10/2019 1235   K 3.6 07/10/2019 1235   CL 106 07/10/2019 1235   CL 109 07/10/2019 1235   CO2 21 (L) 07/10/2019 1235   GLUCOSE 84 07/10/2019 1235   GLUCOSE 86 07/10/2019 1235   BUN 11 07/10/2019 1235   BUN 10 07/10/2019 1235   CREATININE 1.10 07/10/2019 1235   CREATININE 1.20 07/10/2019 1235   CALCIUM 9.3 07/10/2019 1235   PROT 7.1 07/10/2019 1235   ALBUMIN 3.8 07/10/2019 1235   AST 21 07/10/2019 1235   ALT 23 07/10/2019 1235   ALKPHOS 62 07/10/2019 1235   BILITOT 0.6 07/10/2019 1235   GFRNONAA >60 07/10/2019 1235   GFRAA >60 07/10/2019 1235    Lipid Panel     Component Value Date/Time   CHOL 165 06/18/2019 0618   TRIG 37 06/18/2019 0618   HDL 69 06/18/2019 0618   CHOLHDL 2.4 06/18/2019 0618   VLDL 7 06/18/2019 0618   LDLCALC 89 06/18/2019 0618   Imaging I have reviewed the images obtained: CT-scan of the brain-no acute changes  Assessment: 38 year old man brought in for evaluation as an acute code stroke after having sudden onset of left-sided numbness and effort dependent weakness on examination. Symptoms started after an intense argument with a family member. Has an extensive history of psychiatric presentations and recent inpatient psychiatry admission. I suspect his current symptoms are also related to his psychiatric illness and not a true neurological presentation.   Impression: Left-sided numbness brought on by an argument-likely stress reaction  Recommendations: Observe in the ED Can be discharged home and returns back to baseline. If does not return back to baseline, might consider doing an MRI brain. Control of stressors and psychiatric management per his  psychiatrist. Plan discussed with Dr. Lorayne Bender Please call neurology with questions.  -- Amie Portland, MD [facility] Pager: [phone] If 7pm to 7am, please call on call as listed on AMION.   Addendum -Patient symptoms persisted for a few hours in the ED.   EDP obtained MRI brain.  MRI brain negative for acute stroke.   No neurological recommendations.   Can be discharged with psychiatry follow-up. Discussed with Dr. Sherwood Gambler  -- Amie Portland, MD [facility] Pager: [phone] If 7pm to 7am, please call on call as listed on AMION.

## 2019-07-10 NOTE — Code Documentation (Signed)
38yo male arriving to [facility] via [facility] at 1228. Patient from home where he had an argument with family at 87 and reports passing out. He reports subsequent left sided numbness. Code stroke called on arrival to the ED. Stroke team responded to the patient in CT. CT completed. NIHSS 1, see documentation for details and code stroke times. Patient reports left sided decreased sensation on exam. No acute stroke treatment at this time. Code stroke canceled. Bedside handoff with ED RN Hassan Rowan.

## 2020-01-05 IMAGING — MR MR HEAD W/O CM
9 of 10 series · 36 of 48 positions shown · non-contrast
Comparison: CT head 07/10/2019

CLINICAL DATA: Focal neuro deficit less than 6 hours. Rule out
stroke. Left-sided numbness and weakness.

EXAM:
MRI HEAD WITHOUT CONTRAST
TECHNIQUE: Multiplanar, multiecho pulse sequences of the brain and surrounding
structures were obtained without intravenous contrast.

[Series 2: DWI · axial · 3.0mm · 0.94mm/px · z∈[-99,+53]mm · 9 of 104 slices shown (1 of 2)]
[im 1/104]
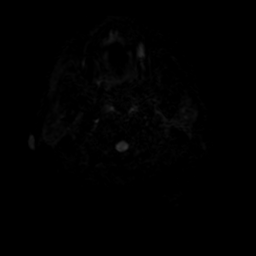
[im 13/104]
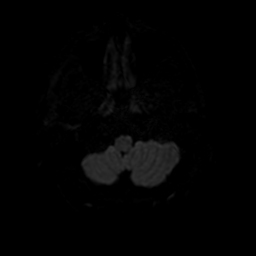
[im 26/104]
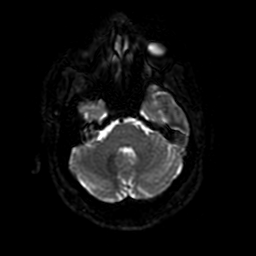
[im 39/104]
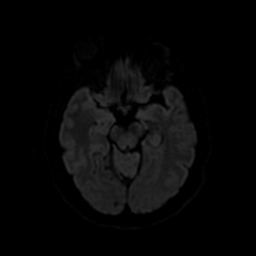
[im 52/104]
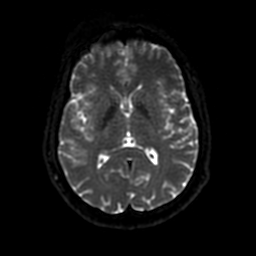
[im 65/104]
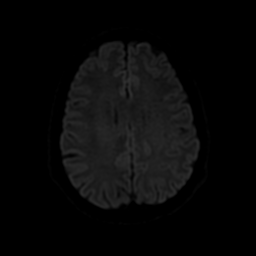
[im 78/104]
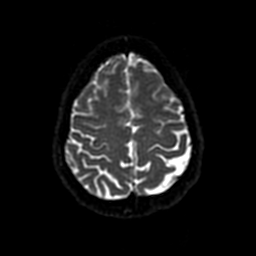
[im 91/104]
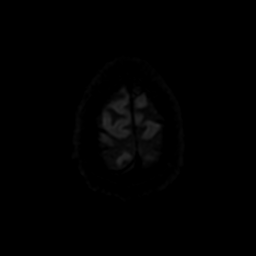
[im 104/104]
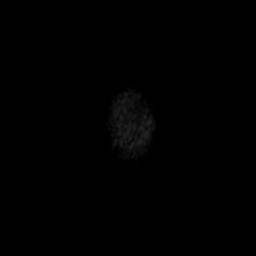

[Series 3: DWI · coronal · 4.0mm · 0.94mm/px · 7 of 72 slices shown (2 of 2)]
[im 1/72]
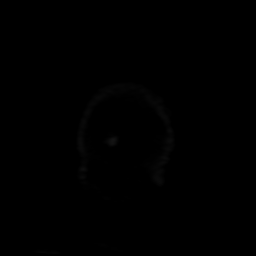
[im 12/72]
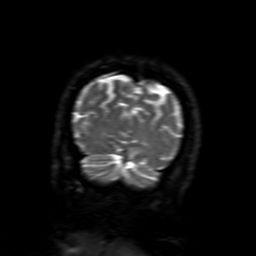
[im 24/72]
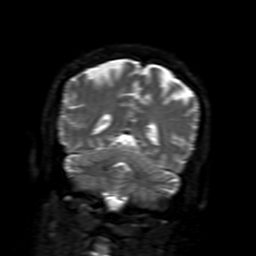
[im 36/72]
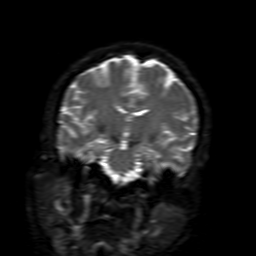
[im 48/72]
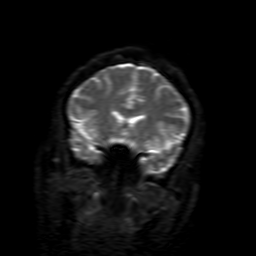
[im 60/72]
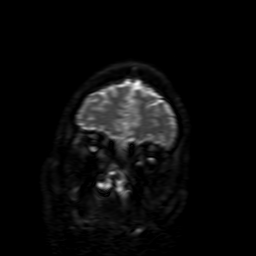
[im 72/72]
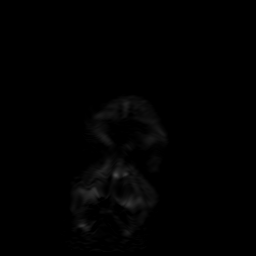

[Series 4: FLAIR · sagittal · 5.0mm · 0.47mm/px · 2 of 26 slices shown (1 of 2)]
[im 1/26]
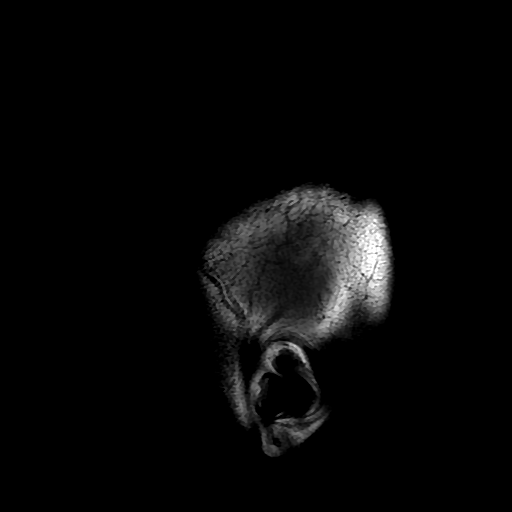
[im 26/26]
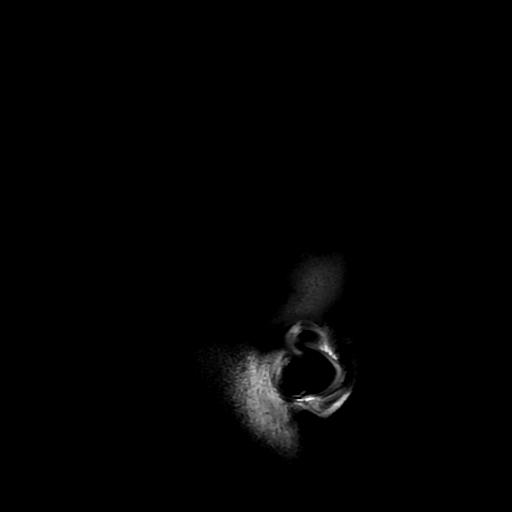

[Series 5: T2 · axial · 5.0mm · 0.47mm/px · z∈[-97,+64]mm · 3 of 28 slices shown (1 of 2)]
[im 1/28]
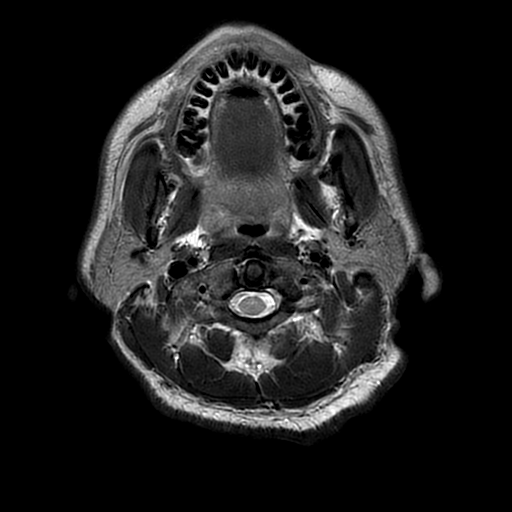
[im 14/28]
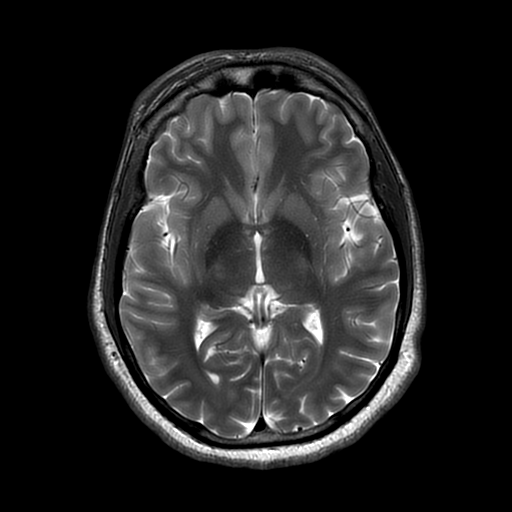
[im 28/28]
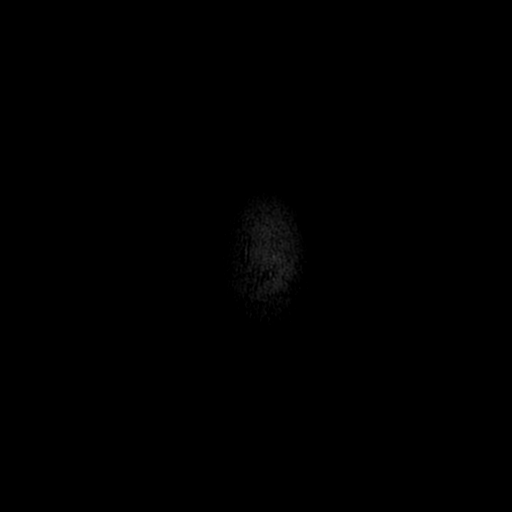

[Series 6: FLAIR · axial · 3.0mm · 0.45mm/px · z∈[-75,+67]mm · 2 of 25 slices shown (2 of 2)]
[im 1/25]
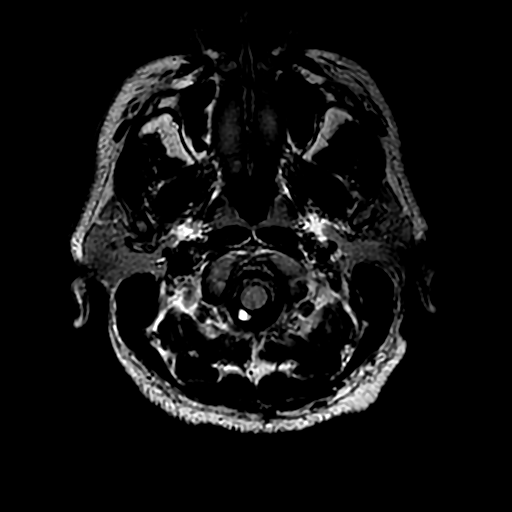
[im 25/25]
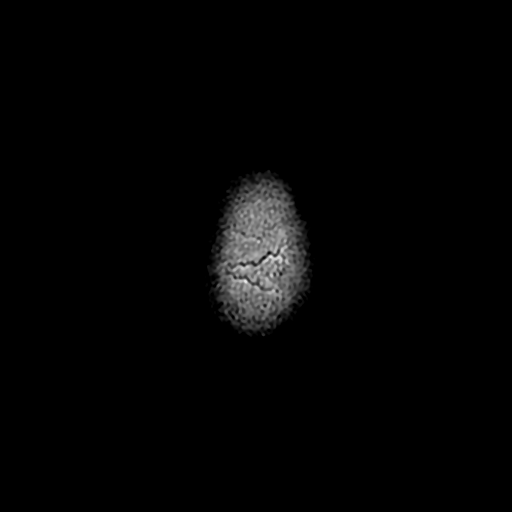

[Series 7: SWI · axial · 3.0mm · 0.47mm/px · z∈[-88,-70]mm · 2 of 100 slices shown]
[im 1/100]
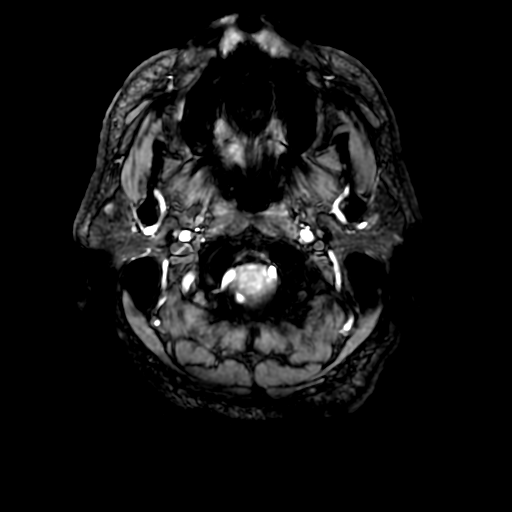
[im 13/100]
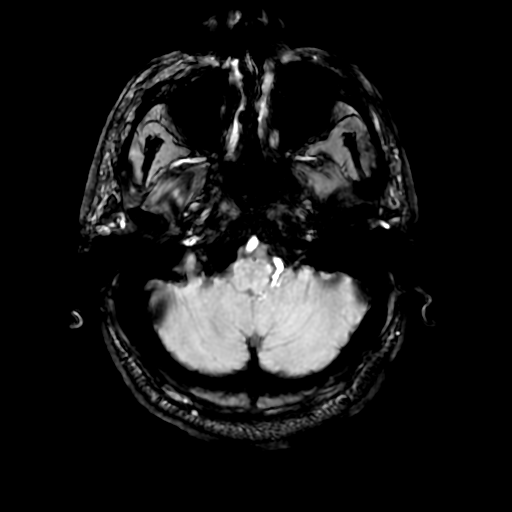

[Series 9: T2 · coronal · 5.0mm · 0.39mm/px · 3 of 30 slices shown (2 of 2)]
[im 1/30]
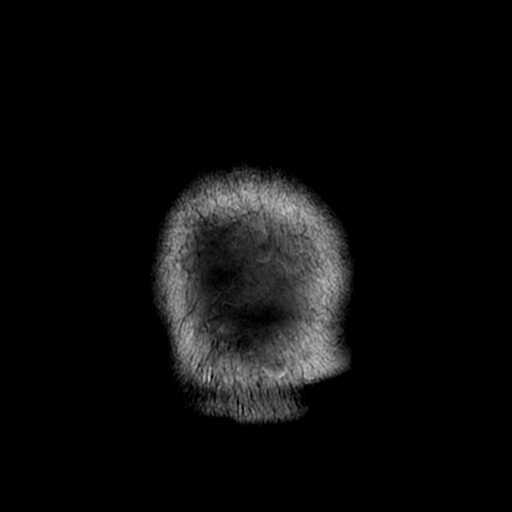
[im 15/30]
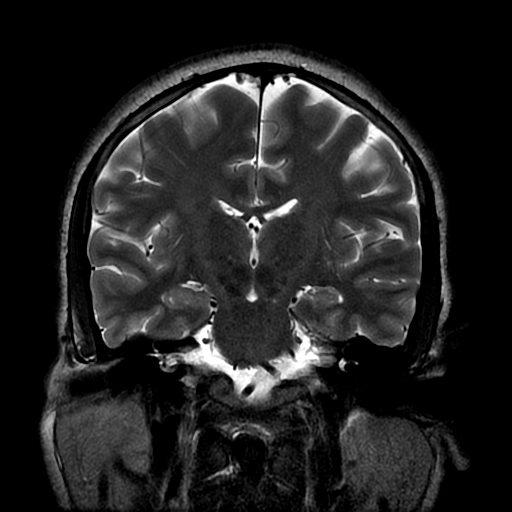
[im 30/30]
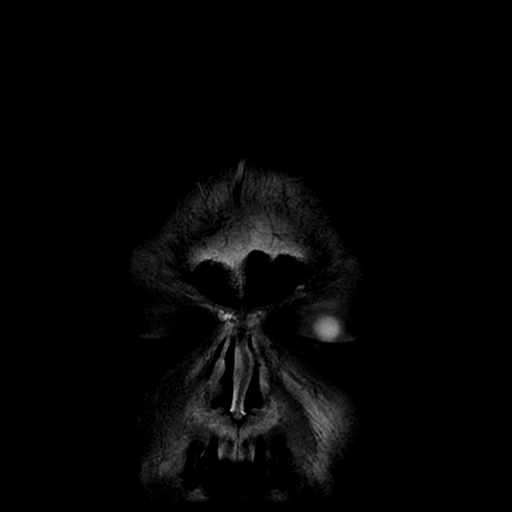

[Series 250: ADC · axial · 3.0mm · 0.94mm/px · z∈[-99,+53]mm · 5 of 52 slices shown (1 of 2)]
[im 1/52]
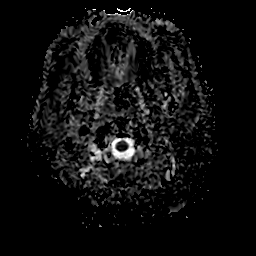
[im 13/52]
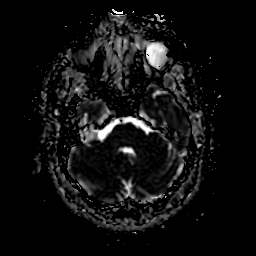
[im 26/52]
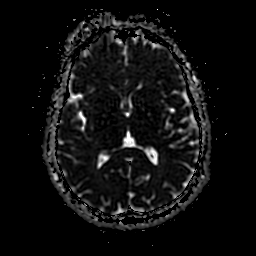
[im 39/52]
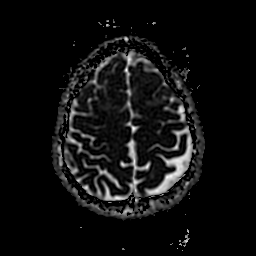
[im 52/52]
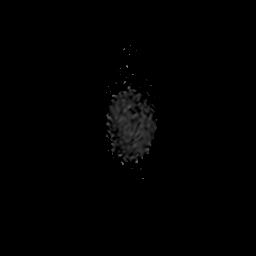

[Series 350: ADC · coronal · 4.0mm · 0.94mm/px · 3 of 36 slices shown (2 of 2)]
[im 1/36]
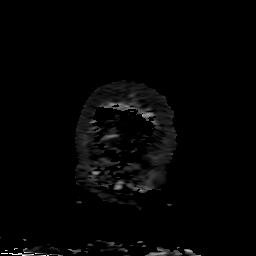
[im 18/36]
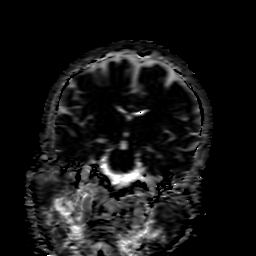
[im 36/36]
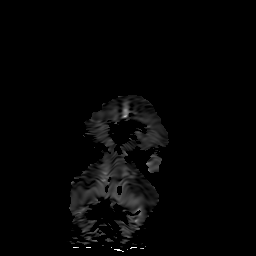

[36 of 48 positions shown; findings below may reference images not displayed]

FINDINGS: Brain: Negative for acute infarct. Ventricle size normal. Negative
for hemorrhage or mass. Two or 3 tiny white matter hyperintensities
in the frontal lobes bilaterally nonspecific.

Vascular: Normal arterial flow voids

Skull and upper cervical spine: Negative

Sinuses/Orbits: Mild mucosal edema paranasal sinuses.  Normal orbit

Other: None
IMPRESSION: No acute abnormality

## 2020-04-13 ENCOUNTER — Encounter (HOSPITAL_COMMUNITY): Payer: Self-pay

## 2021-04-14 ENCOUNTER — Encounter (HOSPITAL_COMMUNITY): Payer: Self-pay | Admitting: *Deleted
